# Patient Record
Sex: Female | Born: 1970 | Race: Asian | Hispanic: No | Marital: Married | Smoking: Never smoker
Health system: Southern US, Community
[De-identification: ages and names within clinical notes are randomized; demographics above are authoritative.]

## PROBLEM LIST (undated history)

## (undated) DIAGNOSIS — Z975 Presence of (intrauterine) contraceptive device: Secondary | ICD-10-CM

## (undated) DIAGNOSIS — K429 Umbilical hernia without obstruction or gangrene: Secondary | ICD-10-CM

## (undated) DIAGNOSIS — R102 Pelvic and perineal pain: Principal | ICD-10-CM

## (undated) DIAGNOSIS — N83209 Unspecified ovarian cyst, unspecified side: Secondary | ICD-10-CM

## (undated) HISTORY — PX: PELVIC LAPAROSCOPY: SHX162

## (undated) HISTORY — DX: Presence of (intrauterine) contraceptive device: Z97.5

## (undated) HISTORY — DX: Unspecified ovarian cyst, unspecified side: N83.209

## (undated) HISTORY — DX: Pelvic and perineal pain: R10.2

## (undated) HISTORY — PX: BREAST BIOPSY: SHX20

---

## 2001-01-14 ENCOUNTER — Encounter: Payer: Self-pay | Admitting: Obstetrics and Gynecology

## 2001-01-14 ENCOUNTER — Ambulatory Visit (HOSPITAL_COMMUNITY): Admission: RE | Admit: 2001-01-14 | Discharge: 2001-01-14 | Payer: Self-pay | Admitting: Obstetrics and Gynecology

## 2001-04-02 ENCOUNTER — Encounter (INDEPENDENT_AMBULATORY_CARE_PROVIDER_SITE_OTHER): Payer: Self-pay | Admitting: Specialist

## 2001-04-02 ENCOUNTER — Ambulatory Visit (HOSPITAL_BASED_OUTPATIENT_CLINIC_OR_DEPARTMENT_OTHER): Admission: RE | Admit: 2001-04-02 | Discharge: 2001-04-02 | Payer: Self-pay | Admitting: Obstetrics and Gynecology

## 2002-03-27 ENCOUNTER — Other Ambulatory Visit: Admission: RE | Admit: 2002-03-27 | Discharge: 2002-03-27 | Payer: Self-pay | Admitting: Obstetrics and Gynecology

## 2002-05-14 ENCOUNTER — Ambulatory Visit (HOSPITAL_BASED_OUTPATIENT_CLINIC_OR_DEPARTMENT_OTHER): Admission: RE | Admit: 2002-05-14 | Discharge: 2002-05-14 | Payer: Self-pay | Admitting: Obstetrics and Gynecology

## 2002-05-14 ENCOUNTER — Encounter (INDEPENDENT_AMBULATORY_CARE_PROVIDER_SITE_OTHER): Payer: Self-pay | Admitting: Specialist

## 2002-10-01 ENCOUNTER — Other Ambulatory Visit: Admission: RE | Admit: 2002-10-01 | Discharge: 2002-10-01 | Payer: Self-pay | Admitting: Gynecology

## 2003-02-15 ENCOUNTER — Encounter: Admission: RE | Admit: 2003-02-15 | Discharge: 2003-02-15 | Payer: Self-pay | Admitting: Gynecology

## 2003-02-18 ENCOUNTER — Encounter: Admission: RE | Admit: 2003-02-18 | Discharge: 2003-02-18 | Payer: Self-pay | Admitting: Gynecology

## 2003-02-22 ENCOUNTER — Encounter: Admission: RE | Admit: 2003-02-22 | Discharge: 2003-02-22 | Payer: Self-pay | Admitting: Gynecology

## 2003-02-26 ENCOUNTER — Encounter: Admission: RE | Admit: 2003-02-26 | Discharge: 2003-02-26 | Payer: Self-pay | Admitting: Gynecology

## 2003-03-02 ENCOUNTER — Encounter: Admission: RE | Admit: 2003-03-02 | Discharge: 2003-03-02 | Payer: Self-pay | Admitting: *Deleted

## 2003-03-05 ENCOUNTER — Encounter: Admission: RE | Admit: 2003-03-05 | Discharge: 2003-03-05 | Payer: Self-pay | Admitting: *Deleted

## 2003-03-09 ENCOUNTER — Encounter: Admission: RE | Admit: 2003-03-09 | Discharge: 2003-03-09 | Payer: Self-pay | Admitting: *Deleted

## 2003-03-12 ENCOUNTER — Encounter: Admission: RE | Admit: 2003-03-12 | Discharge: 2003-03-12 | Payer: Self-pay | Admitting: Gynecology

## 2003-03-16 ENCOUNTER — Encounter: Admission: RE | Admit: 2003-03-16 | Discharge: 2003-03-16 | Payer: Self-pay | Admitting: Gynecology

## 2003-03-19 ENCOUNTER — Encounter: Admission: RE | Admit: 2003-03-19 | Discharge: 2003-03-19 | Payer: Self-pay | Admitting: Gynecology

## 2003-03-23 ENCOUNTER — Encounter: Admission: RE | Admit: 2003-03-23 | Discharge: 2003-03-23 | Payer: Self-pay | Admitting: *Deleted

## 2003-03-24 ENCOUNTER — Encounter (INDEPENDENT_AMBULATORY_CARE_PROVIDER_SITE_OTHER): Payer: Self-pay | Admitting: *Deleted

## 2003-03-24 ENCOUNTER — Inpatient Hospital Stay (HOSPITAL_COMMUNITY): Admission: RE | Admit: 2003-03-24 | Discharge: 2003-03-27 | Payer: Self-pay | Admitting: Gynecology

## 2003-10-04 ENCOUNTER — Other Ambulatory Visit: Admission: RE | Admit: 2003-10-04 | Discharge: 2003-10-04 | Payer: Self-pay | Admitting: Obstetrics and Gynecology

## 2004-10-11 ENCOUNTER — Other Ambulatory Visit: Admission: RE | Admit: 2004-10-11 | Discharge: 2004-10-11 | Payer: Self-pay | Admitting: Obstetrics and Gynecology

## 2005-11-20 ENCOUNTER — Other Ambulatory Visit: Admission: RE | Admit: 2005-11-20 | Discharge: 2005-11-20 | Payer: Self-pay | Admitting: Obstetrics and Gynecology

## 2007-01-07 ENCOUNTER — Other Ambulatory Visit: Admission: RE | Admit: 2007-01-07 | Discharge: 2007-01-07 | Payer: Self-pay | Admitting: Obstetrics and Gynecology

## 2008-02-06 ENCOUNTER — Ambulatory Visit: Payer: Self-pay | Admitting: Obstetrics and Gynecology

## 2008-02-06 ENCOUNTER — Encounter: Payer: Self-pay | Admitting: Obstetrics and Gynecology

## 2008-02-06 ENCOUNTER — Other Ambulatory Visit: Admission: RE | Admit: 2008-02-06 | Discharge: 2008-02-06 | Payer: Self-pay | Admitting: Obstetrics and Gynecology

## 2008-02-17 ENCOUNTER — Ambulatory Visit: Payer: Self-pay | Admitting: Obstetrics and Gynecology

## 2008-02-27 ENCOUNTER — Ambulatory Visit: Payer: Self-pay | Admitting: Obstetrics and Gynecology

## 2008-04-05 ENCOUNTER — Ambulatory Visit: Payer: Self-pay | Admitting: Obstetrics and Gynecology

## 2009-03-07 ENCOUNTER — Ambulatory Visit: Payer: Self-pay | Admitting: Obstetrics and Gynecology

## 2009-03-07 ENCOUNTER — Other Ambulatory Visit: Admission: RE | Admit: 2009-03-07 | Discharge: 2009-03-07 | Payer: Self-pay | Admitting: Obstetrics and Gynecology

## 2009-12-22 ENCOUNTER — Encounter: Admission: RE | Admit: 2009-12-22 | Discharge: 2009-12-22 | Payer: Self-pay | Admitting: Obstetrics and Gynecology

## 2010-03-13 ENCOUNTER — Ambulatory Visit
Admission: RE | Admit: 2010-03-13 | Discharge: 2010-03-13 | Payer: Self-pay | Source: Home / Self Care | Attending: Obstetrics and Gynecology | Admitting: Obstetrics and Gynecology

## 2010-03-13 ENCOUNTER — Other Ambulatory Visit: Payer: Self-pay | Admitting: Obstetrics and Gynecology

## 2010-03-13 ENCOUNTER — Other Ambulatory Visit
Admission: RE | Admit: 2010-03-13 | Discharge: 2010-03-13 | Payer: Self-pay | Source: Home / Self Care | Admitting: Obstetrics and Gynecology

## 2010-03-15 ENCOUNTER — Ambulatory Visit
Admission: RE | Admit: 2010-03-15 | Discharge: 2010-03-15 | Payer: Self-pay | Source: Home / Self Care | Attending: Obstetrics and Gynecology | Admitting: Obstetrics and Gynecology

## 2010-06-05 ENCOUNTER — Ambulatory Visit: Payer: Self-pay | Admitting: Obstetrics and Gynecology

## 2010-06-05 ENCOUNTER — Other Ambulatory Visit: Payer: Self-pay

## 2010-07-07 NOTE — H&P (Signed)
NAME:  Carolyn Ferguson, Carolyn Ferguson                         ACCOUNT NO.:  [id number]   MEDICAL RECORD NO.:  [id number]                   PATIENT TYPE:  INP   LOCATION:  NA                                   FACILITY:  [facility]   PHYSICIAN:  Timothy P. Fontaine, M.D.           DATE OF BIRTH:  [birth year]   DATE OF ADMISSION:  03/30/2003  DATE OF DISCHARGE:                                HISTORY & PHYSICAL   Being admitted on March 30, 2003 at 9:30 for surgery.   CHIEF COMPLAINT:  1. Pregnancy at 37 weeks.  2. Twin gestation.  3. Malpresentation.   HISTORY OF PRESENT ILLNESS:  A 40 year old G64, P92 female at 75 weeks'  gestation with a twin gestation.  The patient has been followed with  antepartum testing which has been reassuring with concordant fetal growth.  She is a G 37 weeks and is admitted at this time for a primary section due  to malpresentation with the presenting fetus in the breech presentation.   PAST MEDICAL HISTORY:  Unremarkable.   PAST SURGICAL HISTORY:  Includes laparoscopy x 3 for endometriosis.   ALLERGIES:  None.   REVIEW OF SYSTEMS:  Noncontributory.   FAMILY HISTORY:  Noncontributory.   SOCIAL HISTORY:  Noncontributory.   ADMISSION PHYSICAL EXAMINATION:  VITAL SIGNS:  Afebrile.  Vital signs are  stable.  HEENT:  Normal.  LUNGS:  Clear.  CARDIAC:  Regular rate without rubs, murmurs or gallops.  ABDOMEN:  Gravid abdomen.  Positive fetal heart tones x 2.  PELVIC:  Deferred.   ASSESSMENT:  A 40 year old gravida 3 para 1 female at 61 weeks' twin  gestation concordant growth for primary cesarean section due to  malpresentation.   Risks, benefits, indications and alternatives for the procedure were  reviewed with the patient.  She also desires permanent sterilization with a  tubal ligation at the same time.  The permanency of the procedure as well as  the potential for failure were reviewed with her, and she understands and  accepts these risks.  The risk of infection  requiring prolonged antibiotics,  abscess formation, as well as wound complications requiring opening and  draining of incisions, closure by secondary intention were all discussed,  understood and accepted.  The risks of bleeding leading to hemorrhage  necessitating transfusion and the risks of transfusion to include  transfusion reaction, hepatitis, HIV, mad cow disease, and other unknown  entities were all discussed, understood and accepted.  The risk of  inadvertent injury to internal organs including bowel, bladder ureters,  vessels and nerves necessitating major exploratory reparative surgeries and  future reparative surgeries including ostomy formation were all discussed  with her.  The risks of fetal injury during the birthing process to include  musculoskeletal, neural, scalpel injuries were all discussed, understood and  accepted.  The patient's questions were answered to her satisfaction.  She  is ready to proceed with surgery.  Timothy P. Audie Box, M.D.    TPF/MEDQ  D:  03/24/2003  T:  03/24/2003  Job:  [id number]

## 2010-07-07 NOTE — Op Note (Signed)
[facility]  Patient:    Carolyn Ferguson, Carolyn Ferguson Visit Number: [id number] MRN: [id number]          Service Type: NES Location: [facility] Attending Physician:  Sharon Mt Dictated by:   Rande Brunt. Eda Paschal, M.D. Proc. Date: 04/02/01 Admit Date:  04/02/2001                             Operative Report  PREOPERATIVE DIAGNOSES:  1. Cyclical pelvic pain worse with period.  2. Right adnexal mass probably hydrosalpinx.  POSTOPERATIVE DIAGNOSES:  Endometriosis with cyclical pelvic pain.  OPERATION PERFORMED:  Diagnostic laparoscopy with laser vaporization of endometriosis and lysis of pelvic adhesive disease.  SURGEON:  Daniel L. Eda Paschal, M.D.  ANESTHESIA:  General endotracheal.  INDICATIONS FOR PROCEDURE:  The patient is a 40 year old female, gravida 2, para 1, AB 1 who presented to the office with a progressive history of increasing severe dysmenorrhea and pelvic pain. It is nonresponsive to nonsteroidal anti-inflammatory drugs as it was previously. On ultrasound, there was a large 3 x 4 cm mass on the right that looked to be most consistent with a hydrosalpinx. She now enters the hospital for laparoscopy and appropriate surgery.  FINDINGS:  At the time of laparoscopy, the first thing that was noted was a lot of old blood in the cul-de-sac, it looked old rather than fresh. Careful inspection was done to be sure there hadnt been any vascular injuries and there had not been. The right ovary was adherent to the broad ligament. When it was freed up, it was slightly enlarge and it appeared to be consistent with an endometrioma; however, when the ovary was opened it just drained some clear fluid and was felt to be just a functional cyst. There was, however, on the ovarian capsule, some hyperemic red areas that did look consistent with endometriosis. The right fallopian tube was normal with luxuriant fimbria. On the left side, the left fallopian tube was  densely adherent to the back of the uterus as was the ovary. When it was freed up, it had normal fimbria; however, there was an obvious point approximately 2/3 down the tube in the ampullary portion consistent with a stricture from a previous linear salpingostomy. The ovary itself did not show endometriosis but it was adherent. The most predominant findings, however, was a significant amount of endometriotic growth on the serosa of her uterus starting at the top of the fundus and going posteriorly. This covered a significant part of the uterine wall. In addition, there was some bulging in the cul-de-sac as if there was a cul-de-sac mass. When it was partially excised, however, no evidence of a mass was seen. There were several peritoneal windows. Because of the incredible amount of inflammatory response in the endometriosis, it was very difficult to see where it would be safe to remove tissue and not to and therefore a lot of this was left in place.  DESCRIPTION OF PROCEDURE:  After adequate general endotracheal anesthesia, the patient was placed in the dorsal lithotomy position, prepped and draped in the usual sterile manner. Her bladder was emptied with the [facility] catheter. A Hulka catheter was inserted into the uterus and pneumoperitoneum was created with the Veress needle. Then 3 1/2 liters of carbon dioxide were utilized. When the pneumoperitoneum had been created, a 10 mm trocar was placed subumbilically and through that the operating laparoscope was placed. Additional instrumentation was done through two 5 mm ports, one  suprapubically and one in the right lower quadrant. Irrigator aspirator, atraumatic grasping instruments and a neodymium YAG laser were utilized. The neodymium YAG laser was utilized with a GR-6 tip over the bare fiber at 13 watts power. First the right adnexa was freed up with the laser. The ovarian capsule was opened where it was more prominent and some clear  fluid drained consistent with a functional cyst. The ovarian capsule changes consistent with endometriosis were lasered to excise them. The fallopian tube itself looked normal with luxuriant fimbria. Next the area in the cul-de-sac was partially opened. It was very difficult to get good tissue plans because of the severe inflammatory response and it was really not possible to excise that entire area but the feeling at the end of the procedure is that probably there was no mass there but that probably the peritoneal tissue did have endometriosis and some of this was not lasered because of concern about where it was safe to laser and not. The next thing that was done was the left ovary and tube were completely freed up except there was one attachment of the ovary into the cul-de-sac that was so densely adherent and where the plane could not be ascertained that portion of the ovary was left attached. Indigo carmine was introduced through her cervix and the right fallopian tube was patent. The left fallopian tube filled approximately 2/3 and in the ampullary portion, there was an obvious stricture where I think the tube had closed after her linear salpingostomy. The extensive amount of endometriosis on the uterus was lasered and removed, some of it was cauterized with the bipolar cautery both for excision and also for hemostasis. Three of four samples were sent for tissue diagnosis. Following the termination of the procedure, there was absolutely no bleeding noted, all cul-de-sac fluid was removed. It was felt that the endometriosis had not been completely debulked but it was also felt that with the extent of what was found that it was not assessable to complete laparoscopic removal. The pneumoperitoneum was evacuated, the subumbilical incision was closed to the fascia with #0 Vicryl and all three skin incisions were closed with 4-0 monocryl. Estimated blood loss for the entire procedure was  less than 200 cc with none replaced. The patient tolerated the procedure well and left the operating room in satisfactory condition. Dictated by:   Rande Brunt. Eda Paschal, M.D.  Attending Physician:  Sharon Mt DD:  04/02/01 TD:  04/02/01 Job: [id number]

## 2010-07-07 NOTE — Discharge Summary (Signed)
NAME:  LANDI, BISCARDI                         ACCOUNT NO.:  [id number]   MEDICAL RECORD NO.:  [id number]                   PATIENT TYPE:  INP   LOCATION:  9147                                 FACILITY:  [facility]   PHYSICIAN:  Juan H. Lily Peer, M.D.             DATE OF BIRTH:  1970-11-25   DATE OF ADMISSION:  03/24/2003  DATE OF DISCHARGE:  03/27/2003                                 DISCHARGE SUMMARY   DISCHARGE DIAGNOSES:  1. Intrauterine pregnancy at 36+ weeks delivered.  2. Twins, breech/transverse presentation.  3. Spontaneous rupture of membranes.  4. Desired permanent sterilization.  5. Status post left salpingo-oophorectomy in the past.  6. Pelvic adhesive disease.  7. Status post primary low transverse Cesarean section, right tubal     sterilization, modified Pomeroy technique and lysis of adhesions by Dr.     Colin Broach on March 25, 2003.   HISTORY:  This is a 40 year old female, gravida 3, para 1, with an EDC of  April 16, 2003.  Her prenatal course has been complicated by this  initially triplet pregnancy which was reduced to twins. She underwent CVS  which revealed normal chromosomes and the patient was followed with  antepartum revealing reassuring fetal growth, both found to be  breech/transverse presentation.   HOSPITAL COURSE:  On March 24, 2003, the patient was admitted with  spontaneous rupture of membranes at 36+ weeks and therefore underwent a  primary low transverse cervical Cesarean section, right tubal sterilization,  modified Pomeroy technique, and lysis of adhesions. The patient underwent  delivery of  a female, Apgars of 9/9, weight 5 pounds 2 ounces.  A second female  was delivered with weight of 5 pounds 3 ounces, Apgars of 8 and 9.  It was  noted that there were filmy adhesions to the posterior cul-de-sac to the  uterus which was lysed. Postoperatively, the patient remained afebrile,  voiding, in stable condition. She was discharged to home on  March 27, 2003, and given [facility] Gynecology instructions.   The patient is O positive, rubella immune.  On March 25, 2003, hemoglobin  11.7.   DISPOSITION:  The patient is discharged to home, followup  appointment in  six weeks. If any problems prior to this time to return to the office. She  is given a prescription for Percocet p.r.n. pain and Motrin p.r.n. pain.     Susa Loffler, P.A.                    Juan H. Lily Peer, M.D.    TSG/MEDQ  D:  04/26/2003  T:  04/26/2003  Job:  [id number]

## 2010-07-07 NOTE — Op Note (Signed)
NAME:  Carolyn Ferguson, Carolyn Ferguson                         ACCOUNT NO.:  [id number]   MEDICAL RECORD NO.:  [id number]                   PATIENT TYPE:  INP   LOCATION:  9198                                 FACILITY:  [facility]   PHYSICIAN:  Timothy P. Fontaine, M.D.           DATE OF BIRTH:  03-20-1970   DATE OF PROCEDURE:  03/25/2003  DATE OF DISCHARGE:                                 OPERATIVE REPORT   PREOPERATIVE DIAGNOSES:  1. Pregnancy at 36-37 weeks.  2. Twins, breech transverse presentation.  3. Spontaneous rupture of membranes.  4. Desires permanent stabilization.  5. Status post left salpingo-oophorectomy in the past.   POSTOPERATIVE DIAGNOSES:  1. Pregnancy at 36-37 weeks.  2. Twins, breech transverse presentation.  3. Spontaneous rupture of membranes.  4. Desires permanent stabilization.  5. Status post left salpingo-oophorectomy in the past.  6. Pelvic adhesive disease.   PROCEDURES:  1. Primary low transverse cervical cesarean section.  2. Right tubal sterilization, modified Pomeroy technique.  3. Lysis of adhesions.   SURGEON:  Timothy P. Fontaine, M.D.   ASSISTANT:  Ivor Costa. Farrel Gobble, M.D.   ANESTHESIA:  Spinal.   ESTIMATED BLOOD LOSS:  Less than 500 mL.   COMPLICATIONS:  None.   SPECIMENS:  1. Placenta, umbilical cord clamp on twin A.  2. Samples of cord blood, twin A.  3. Samples of cord blood, twin B.   FINDINGS:  Twin A, female, at 1736, 5 pounds 2 ounces, Apgars 9 and 9.  Twin  B, female, at 1738, 5 pounds 3 ounces, Apgars 8 and 9.  Twin A was in the  frank breech presentation.  Twin B was in the transverse presentation.  A  veil of filmy adhesions from the posterior cul-de-sac to the uterus, which  was lysed with the electrocautery.  Left adnexa noted to be surgically  absent.  Right fallopian tube and ovary noted to be encased in filmy  adhesions, freed to allow the tubal sterilization.   DESCRIPTION OF PROCEDURE:  The patient was taken to the operating room,  underwent spinal anesthesia, was placed in the left tilt supine position,  and received an abdominal preparation with Betadine scrub and Betadine  solution, bladder emptied with an indwelling Foley catheterization, and the  patient was draped in the usual fashion.  After assuring adequate  anesthesia, the abdomen was sharply entered through a Pfannenstiel incision,  achieving adequate hemostasis at all levels.  The bladder flap was sharply  and bluntly developed without difficulty.  The uterus was sharply entered in  the lower uterine segment, bluntly extended laterally.  The bulging  membranes were ruptured, fluid noted to be clear.  Twin A was found in the  frank breech presentation and was delivered directly frank breech without  difficulty.  The nares and mouth suctioned, the cord doubly clamped and cut,  and the infant was handed to pediatrics in attendance.  A cord clamp was  placed on cord A for identification.  The bulging membranes of twin B were  then ruptured, the infant found to be in the transverse presentation and was  converted to a vertex and was delivered without difficulty.  A loose nuchal  cord was noted, which was reduced.  The nares and mouth were suctioned, the  rest of the infant delivered, the cord doubly clamped and cut, and the  infant was handed to pediatrics in attendance.  Samples of cord blood from  both umbilical cords were obtained.  The placenta was then spontaneously  extracted and noted to be intact and was sent to pathology.  The uterus was  exteriorized, the endometrial cavity explored with a sponge to remove all  placental membrane fragments.  The uterine incision was then closed in one  layer using 0 Vicryl suture in a running interlocking stitch.  Several  figure-of-eight sutures were placed along the incision line to achieve  ultimate hemostasis.  Attention was then turned to the tubal sterilization.  Examination of the left adnexa revealed an absent  tube and ovary, and there  was a filmy veil of adhesions across the entire posterior uterine surface,  which was sharply lysed with the electrocautery without difficulty.  The  right fallopian tube was freed, traced from its insertion to its fimbriated  end, and the mid-tubal segment was elevated, doubly ligated using 0 plain  suture, and the intervening segment sharply excised.  Tubal lumen as well as  adequate hemostasis was grossly visualized.  The uterus was then returned to  the abdomen, which was copiously irrigated showing adequate hemostasis, and  the anterior fascia was reapproximated using 0 Vicryl suture in a running  stitch.  Subcutaneous tissues were irrigated, electrocautery used to achieve  ultimate hemostasis, and the skin was reapproximated using 4-0 Vicryl in a  running subcuticular stitch.  Steri-Strips and Benzoin were applied and a  sterile dressing applied.  The patient taken to the recovery room in good  condition, having tolerated the procedure well.  Of note, the patient did  receive 1 a Ancef on cord clamping.                                               Timothy P. Audie Box, M.D.    TPF/MEDQ  D:  03/24/2003  T:  03/25/2003  Job:  [id number]

## 2010-07-07 NOTE — Op Note (Signed)
NAME:  Carolyn Ferguson, Carolyn Ferguson                           ACCOUNT NO.:  [id number]   MEDICAL RECORD NO.:  [id number]                   PATIENT TYPE:  AMB   LOCATION:  [facility]                                 FACILITY:  [facility]   PHYSICIAN:  Daniel L. Eda Paschal, M.D.           DATE OF BIRTH:  03/01/1970   DATE OF PROCEDURE:  05/14/2002  DATE OF DISCHARGE:                                 OPERATIVE REPORT   PREOPERATIVE DIAGNOSES:  Pelvic pain with endometriosis.   POSTOPERATIVE DIAGNOSES:  Pelvic pain with endometriosis including bilateral  endometriomas.   OPERATION:  Diagnostic laparoscopy with excision of endometriomas, left  salpingectomy, laser vaporization of endometriosis.   SURGEON:  Daniel L. Eda Paschal, M.D.   FIRST ASSISTANT:  Ivor Costa. Farrel Gobble, M.D.   INDICATIONS FOR PROCEDURE:  The patient is a 40 year old female who now has  a several year history of severe pelvic pain associated with endometriosis.  Her periods are becoming increasingly worse in terms of both flow and  dysmenorrhea. She had undergone laparoscopy previously with laser of  endometriosis but on ultrasound she has evidence of bilateral endometriomas  and a possible left hydrosalpinx. She now enters the hospital for definitive  surgery for treatment of the above.   FINDINGS:  At the time of surgery, the patient had extensive endometriosis  although it actually looked better than it had on her first operation. Her  right ovary was enlarged by an endometrioma of about 3 cm, the right  fallopian tube looked basically normal although it was somewhat adherent to  the ovary and had luxuriant fimbria. The right ovary was adherent to the  side wall of the pelvis, the uterus had multiple areas of endometriosis on  the serosa. The sigmoid colon was adherent both to the left adnexa and to  the uterus posteriorly. There was an endometrioma on the left side which was  either part of the surface of the ovary or maybe more likely an  area of  endometriosis adjacent to the ovary and adherent to the back wall of the  uterus and to the sigmoid colon. The left fallopian tube was severely  diseased by endometriosis and densely adherent to the ovary.   DESCRIPTION OF PROCEDURE:  After adequate general endotracheal anesthesia,  the patient was placed in the dorsal lithotomy position, prepped and draped  in the usual sterile manner. A Foley catheter was inserted in the patient's  bladder, a Hulka catheter was put into the ureter. A pneumoperitoneum was  created subumbilically with a Veress needle and then a 10 mm trocar was  placed subumbilically. Two 5 mm ports were placed in the pelvis on the right  lower quadrant and left lower quadrant after the operating laparoscope had  first been placed to be sure that the pneumoperitoneum had been correctly  created and that there was no trauma which there was not. The laparoscope  was attached to a camera, the  other two ports were placed under direct  vision. At this point, pictures were taken for documentation. Through the  procedure, several different instrumentation was utilized to treat the  endometriosis, one was the neodymium YAG laser with a GR-4 tip over the bare  fiber at 12 watts power. In addition, bipolar instrumentation was also  utilized as well. First adhesions were freed up so that the adnexa could be  seen. Some of them could be actually freed up by blunt dissection using  hydrodissection. Some were freed up with the neodymium YAG laser. There was  omentum attached to endometriosis attached to the uterus which was freed up  and sent as a specimen. On the left side on the posterior wall of the uterus  adjacent to the ovary, an area of endometriosis which drained chocolate  material was seen. There was either a surface endometrioma or possibly just  a collection of endometriosis off the posterior wall of the uterus. This was  entered, it was drained and then it was  completely excised. The left  fallopian tube was significantly diseased and it was removed with bipolar  and cutting without interfering with the blood supply to the ovary at all.  Attention was next turned to the endometrium on the right ovary. It was  opened with a laser and then the cyst wall could be separated from the ovary  using grasping instruments and the entire cyst wall was removed and sent to  pathology for tissue diagnosis. The other areas of endometriosis that were  seen were lasered until they were gone. The sigmoid colon was not completely  freed up from the posterior wall of the uterus because of its dense  adherence to it and it was felt that the bowel would probably be entered if  this dissection was completed and the patient did not have a bowel prep.  Copious irrigation was done without with Ringer's lactate and finally at the  end of the procedure there was absolutely no bleeding noted. An attempt was  made to remove all peritoneal fluid with some of it even drained after the  trocars had been removed. The subumbilical fascial incision was closed with  #0 Vicryl and all three skin incisions were closed with 3-0 Monocryl. At the  termination of the procedure, the patient had drained significant clear  urine from her Foley catheter which was removed. Her Hulka catheter was also  removed. Specimen sent to pathology included endometriosis of the pelvis,  cyst wall of the endometriomas, left fallopian tube with endometriosis, and  endometriosis on the serosa of the uterus. The patient left the operating  room in satisfactory condition.                                               Daniel L. Eda Paschal, M.D.    Tonette Bihari  D:  05/14/2002  T:  05/14/2002  Job:  [id number]

## 2010-07-24 ENCOUNTER — Other Ambulatory Visit: Payer: BC Managed Care – PPO

## 2010-07-24 ENCOUNTER — Ambulatory Visit (INDEPENDENT_AMBULATORY_CARE_PROVIDER_SITE_OTHER): Payer: BC Managed Care – PPO | Admitting: Obstetrics and Gynecology

## 2010-07-24 DIAGNOSIS — N831 Corpus luteum cyst of ovary, unspecified side: Secondary | ICD-10-CM

## 2010-07-24 DIAGNOSIS — N809 Endometriosis, unspecified: Secondary | ICD-10-CM

## 2010-07-24 DIAGNOSIS — R1032 Left lower quadrant pain: Secondary | ICD-10-CM

## 2010-08-14 ENCOUNTER — Other Ambulatory Visit (INDEPENDENT_AMBULATORY_CARE_PROVIDER_SITE_OTHER): Payer: BC Managed Care – PPO

## 2010-08-14 ENCOUNTER — Institutional Professional Consult (permissible substitution): Payer: BC Managed Care – PPO | Admitting: Obstetrics and Gynecology

## 2010-08-14 DIAGNOSIS — N83209 Unspecified ovarian cyst, unspecified side: Secondary | ICD-10-CM

## 2010-08-18 ENCOUNTER — Other Ambulatory Visit: Payer: BC Managed Care – PPO

## 2010-09-01 ENCOUNTER — Ambulatory Visit (HOSPITAL_COMMUNITY): Admission: RE | Admit: 2010-09-01 | Payer: Self-pay | Source: Ambulatory Visit | Admitting: Obstetrics and Gynecology

## 2010-10-18 ENCOUNTER — Encounter: Payer: Self-pay | Admitting: Obstetrics and Gynecology

## 2010-10-18 ENCOUNTER — Ambulatory Visit (INDEPENDENT_AMBULATORY_CARE_PROVIDER_SITE_OTHER): Payer: BC Managed Care – PPO | Admitting: Obstetrics and Gynecology

## 2010-10-18 DIAGNOSIS — R82998 Other abnormal findings in urine: Secondary | ICD-10-CM

## 2010-10-18 DIAGNOSIS — M549 Dorsalgia, unspecified: Secondary | ICD-10-CM

## 2010-10-18 DIAGNOSIS — N390 Urinary tract infection, site not specified: Secondary | ICD-10-CM

## 2010-10-18 MED ORDER — NITROFURANTOIN MONOHYD MACRO 100 MG PO CAPS: 100.0000 mg | ORAL_CAPSULE | Freq: Two times a day (BID) | ORAL | Status: AC

## 2010-10-18 NOTE — Progress Notes (Signed)
The patient came in today with a short history of dysuria frequency lower back pain. She has had UTIs in the past and she thinks that she has. This is not related to intercourse at all.  Urinalysis was grossly abnormal.  Assessment: UTI  Plan: Macrobid twice a day with food for 7 days. Urine culture done. Followup UA and culture in one week.

## 2010-11-01 ENCOUNTER — Other Ambulatory Visit: Payer: Self-pay | Admitting: Obstetrics and Gynecology

## 2010-11-01 ENCOUNTER — Ambulatory Visit (INDEPENDENT_AMBULATORY_CARE_PROVIDER_SITE_OTHER): Payer: BC Managed Care – PPO | Admitting: Obstetrics and Gynecology

## 2010-11-01 ENCOUNTER — Other Ambulatory Visit: Payer: BC Managed Care – PPO

## 2010-11-01 DIAGNOSIS — N83209 Unspecified ovarian cyst, unspecified side: Secondary | ICD-10-CM

## 2010-11-01 DIAGNOSIS — M545 Low back pain, unspecified: Secondary | ICD-10-CM

## 2010-11-01 DIAGNOSIS — D3910 Neoplasm of uncertain behavior of unspecified ovary: Secondary | ICD-10-CM

## 2010-11-01 DIAGNOSIS — N8300 Follicular cyst of ovary, unspecified side: Secondary | ICD-10-CM

## 2010-11-01 DIAGNOSIS — R1032 Left lower quadrant pain: Secondary | ICD-10-CM

## 2010-11-01 DIAGNOSIS — N390 Urinary tract infection, site not specified: Secondary | ICD-10-CM

## 2010-11-01 DIAGNOSIS — N921 Excessive and frequent menstruation with irregular cycle: Secondary | ICD-10-CM

## 2010-11-01 NOTE — Progress Notes (Signed)
The patient came back today for followup ultrasound. She also needed her urine rechecked because of a UTI that we treated her for. Her pelvic pain is much improved. She says she will  just have  monthly symptoms that are tolerable. Her urine symptoms are gone.  Ultrasound reveals a retroflexed uterus with IUD in normal position. Right ovary is normal. Left ovary shows a thick walled cyst with internal low level echoes. It is 1.7 cm. This is a significant reduction in size and the last one which was 3.7 cm. Her cul-de-sac is free of fluid.  Assessment: 1. UTI resolved #2. Ovarian cyst reduced in size with pain better.  Plan: 1. We will check a urine today to. She was pushing computer recall for ultrasound in 6 months but will, in January for yearly exam.

## 2011-03-05 ENCOUNTER — Other Ambulatory Visit: Payer: Self-pay | Admitting: Obstetrics and Gynecology

## 2011-03-05 ENCOUNTER — Ambulatory Visit (INDEPENDENT_AMBULATORY_CARE_PROVIDER_SITE_OTHER): Payer: BC Managed Care – PPO | Admitting: Obstetrics and Gynecology

## 2011-03-05 DIAGNOSIS — N390 Urinary tract infection, site not specified: Secondary | ICD-10-CM

## 2011-03-05 DIAGNOSIS — M549 Dorsalgia, unspecified: Secondary | ICD-10-CM

## 2011-03-05 DIAGNOSIS — R82998 Other abnormal findings in urine: Secondary | ICD-10-CM

## 2011-03-05 DIAGNOSIS — N898 Other specified noninflammatory disorders of vagina: Secondary | ICD-10-CM

## 2011-03-05 LAB — WET PREP FOR TRICH, YEAST, CLUE
Clue Cells Wet Prep HPF POC: NEGATIVE
Trich, Wet Prep: NEGATIVE
Yeast Wet Prep HPF POC: NEGATIVE

## 2011-03-05 MED ORDER — NITROFURANTOIN MONOHYD MACRO 100 MG PO CAPS: 100.0000 mg | ORAL_CAPSULE | Freq: Two times a day (BID) | ORAL | Status: AC

## 2011-03-05 NOTE — Progress Notes (Signed)
Patient came to see me today with a history of left-sided back pain and urinary frequency. These are typical symptoms she gets when she has a UTI. She is starting to notice a pattern that this occurs after intercourse. She is also aware of some vaginal discharge and irritation. She is having no vaginal bleeding. She is having no pelvic pain. She wanted to know if back  discomfort also could be related to a recurrent ovarian cyst.  Pelvic exam: External: Within normal limits. BUS within normal limits. Vaginal exam: Within normal limits. Cervix: Clean. IUD string visible.  Uterus: Within normal limits. Adnexa: Within normal limits. Rectovaginal exam: Within normal limits.  Wet prep positive for yeast. Urinalysis abnormal. Kennon Portela present for exam.  Assessment: #1. UTI #2. Yeast vaginitis  Plan: Macrobid twice a day with food for 7 days. Followup UA in one week. Diflucan 150 mg daily for 3 days. Start her on Macrodantin 50 mg tablets take one postcoitally.

## 2011-03-06 ENCOUNTER — Other Ambulatory Visit: Payer: Self-pay | Admitting: Gynecology

## 2011-03-06 ENCOUNTER — Other Ambulatory Visit: Payer: Self-pay | Admitting: Obstetrics and Gynecology

## 2011-03-06 DIAGNOSIS — N390 Urinary tract infection, site not specified: Secondary | ICD-10-CM

## 2011-03-06 LAB — URINALYSIS W MICROSCOPIC + REFLEX CULTURE

## 2011-03-06 LAB — URINALYSIS, ROUTINE W REFLEX MICROSCOPIC
Glucose, UA: NEGATIVE mg/dL
Ketones, ur: NEGATIVE mg/dL
Protein, ur: NEGATIVE mg/dL

## 2011-03-06 LAB — URINALYSIS, MICROSCOPIC ONLY

## 2011-03-07 LAB — URINE CULTURE: Colony Count: 55000

## 2011-04-30 ENCOUNTER — Other Ambulatory Visit: Payer: Self-pay | Admitting: Obstetrics and Gynecology

## 2011-04-30 ENCOUNTER — Ambulatory Visit (INDEPENDENT_AMBULATORY_CARE_PROVIDER_SITE_OTHER): Payer: BC Managed Care – PPO | Admitting: Obstetrics and Gynecology

## 2011-04-30 DIAGNOSIS — N83209 Unspecified ovarian cyst, unspecified side: Secondary | ICD-10-CM

## 2011-04-30 DIAGNOSIS — M549 Dorsalgia, unspecified: Secondary | ICD-10-CM

## 2011-04-30 LAB — URINALYSIS W MICROSCOPIC + REFLEX CULTURE
Glucose, UA: NEGATIVE mg/dL
Nitrite: NEGATIVE
Protein, ur: NEGATIVE mg/dL
Urobilinogen, UA: 0.2 mg/dL (ref 0.0–1.0)

## 2011-04-30 MED ORDER — CIPROFLOXACIN HCL 250 MG PO TABS: 250.0000 mg | ORAL_TABLET | Freq: Two times a day (BID) | ORAL | Status: AC

## 2011-04-30 NOTE — Progress Notes (Signed)
Patient came to see me today with a 2 to three-day history of left flank pain. She has no dysuria or frequency. We have treated her twice since August for UTI. Both times she had these  Symptoms. We used Macrobid both times. She has been doing post coital Macrodantin because there appears to be a relationship with sex. She had sex approximately one week ago which relates to the above.  Exam: Kennon Portela present. Abdomen: Somewhat tender but no rebound. Tenderness mostly suprapubic. Pelvic exam: External: Within normal limits. BUS within normal limits. Vaginal exam: Within normal limits. Cervix: Clean. IUD string visible Uterus: Within normal limits but tender. Adnexa: right within normal limits. Left adnexa slightly enlarged to 4+ centimeters and tender. Rectovaginal exam: Within normal limits.  Urinalysis 3-6 white blood cells per high-power field.  Assessment: Probable recurrent UTI. Recurrent left adnexal mass.  Plan: Urine culture done. Cipro 250 milligrams twice a day for 7 days. Ultrasound scheduled. Discussed switching postcoital antibiotics to Septra. Discussed urological consult. We will see what culture shows and make a decision on the day of ultrasound.

## 2011-05-01 LAB — URINE CULTURE: Colony Count: 9000

## 2011-05-03 ENCOUNTER — Other Ambulatory Visit: Payer: BC Managed Care – PPO

## 2011-05-03 ENCOUNTER — Ambulatory Visit: Payer: BC Managed Care – PPO | Admitting: Obstetrics and Gynecology

## 2011-05-03 ENCOUNTER — Ambulatory Visit (INDEPENDENT_AMBULATORY_CARE_PROVIDER_SITE_OTHER): Payer: BC Managed Care – PPO

## 2011-05-03 ENCOUNTER — Ambulatory Visit (INDEPENDENT_AMBULATORY_CARE_PROVIDER_SITE_OTHER): Payer: BC Managed Care – PPO | Admitting: Obstetrics and Gynecology

## 2011-05-03 DIAGNOSIS — N83209 Unspecified ovarian cyst, unspecified side: Secondary | ICD-10-CM

## 2011-05-03 DIAGNOSIS — N831 Corpus luteum cyst of ovary, unspecified side: Secondary | ICD-10-CM

## 2011-05-03 DIAGNOSIS — N644 Mastodynia: Secondary | ICD-10-CM

## 2011-05-03 NOTE — Progress Notes (Signed)
Patient came back today for ultrasound. Her pain is 90% better. She has been on Cipro. Her urine culture came back negative. She's been having a lot of breast tenderness. It is bilateral. Her nipples are also sensitive.  Breast exam: Kennon Portela present. Her breasts were carefully examined the sitting and lying position. She is lumpy in the upper-outer quadrants of both breasts without dominant lesion.  Ultrasound: On ultrasound today her uterus is anteverted with an IUD in normal position. The endometrial echo is 5.5 mm. Her right ovary is normal. Her left ovary shows a thin walled cystic mass of 4.5 cm. Is consistent with her pelvic findings on Monday. There is a reticular echo pattern. It appears to be a hemorrhagic cyst. There is negative vascular flow to the mass. Her cul-de-sac is free of fluid.  Assessment: #1. Left ovarian cyst #2. Pelvic pain significantly better #3. Mastodynia  Plan: Patient will stop her Cipro. She will let me know if mastodynia persists. Assuming she continues to be relatively pain-free she will return in 3 months for followup ultrasound. She will call me sooner if pain increases.

## 2011-05-15 ENCOUNTER — Other Ambulatory Visit (HOSPITAL_COMMUNITY)
Admission: RE | Admit: 2011-05-15 | Discharge: 2011-05-15 | Disposition: A | Discharge status: Home / Self Care | Payer: BC Managed Care – PPO | Source: Ambulatory Visit | Attending: Obstetrics and Gynecology | Admitting: Obstetrics and Gynecology

## 2011-05-15 ENCOUNTER — Ambulatory Visit (INDEPENDENT_AMBULATORY_CARE_PROVIDER_SITE_OTHER): Payer: BC Managed Care – PPO | Admitting: Obstetrics and Gynecology

## 2011-05-15 ENCOUNTER — Encounter: Payer: Self-pay | Admitting: Obstetrics and Gynecology

## 2011-05-15 VITALS — BP 110/60 | Ht 65.0 in | Wt 129.0 lb

## 2011-05-15 DIAGNOSIS — Z01419 Encounter for gynecological examination (general) (routine) without abnormal findings: Secondary | ICD-10-CM

## 2011-05-15 DIAGNOSIS — N83209 Unspecified ovarian cyst, unspecified side: Secondary | ICD-10-CM | POA: Insufficient documentation

## 2011-05-15 DIAGNOSIS — N809 Endometriosis, unspecified: Secondary | ICD-10-CM | POA: Insufficient documentation

## 2011-05-15 NOTE — Progress Notes (Signed)
Patient came back to see me for her annual GYN exam. Her pain which we recently saw her for is gone. She remains amenorrhic with her IUD. Prior to the IUD she has severe dysmenorrhea and menorrhagia. She is scheduled for followup ultrasound for a left ovarian cyst in 3 months. She had a mammogram last year. She does her lab work from her PCP.  Physical examination: Kennon Portela present. HEENT within normal limits. Neck: Thyroid not large. No masses. Supraclavicular nodes: not enlarged. Breasts: Examined in both sitting and lying  position. No skin changes and no masses. Abdomen: Soft no guarding rebound or masses or hernia. Pelvic: External: Within normal limits. BUS: Within normal limits. Vaginal:within normal limits. Good estrogen effect. No evidence of cystocele rectocele or enterocele. Cervix: clean IUD string visible. Uterus: Normal size and shape. Adnexa: No masses. Rectovaginal exam: Confirmatory and negative. Extremities: Within normal limits.  Assessment: Endometriosis. Left ovarian cyst.  Plan: Continue yearly mammograms. Followup ultrasound in 3 months.

## 2011-05-16 LAB — URINALYSIS W MICROSCOPIC + REFLEX CULTURE
Bacteria, UA: NONE SEEN
Casts: NONE SEEN
Hgb urine dipstick: NEGATIVE
Ketones, ur: NEGATIVE mg/dL
Leukocytes, UA: NEGATIVE
Nitrite: NEGATIVE
Protein, ur: NEGATIVE mg/dL
Urobilinogen, UA: 0.2 mg/dL (ref 0.0–1.0)
pH: 5.5 (ref 5.0–8.0)

## 2011-05-29 ENCOUNTER — Other Ambulatory Visit: Payer: Self-pay | Admitting: Obstetrics and Gynecology

## 2011-05-29 DIAGNOSIS — Z1231 Encounter for screening mammogram for malignant neoplasm of breast: Secondary | ICD-10-CM

## 2011-06-06 ENCOUNTER — Ambulatory Visit
Admission: RE | Admit: 2011-06-06 | Discharge: 2011-06-06 | Disposition: A | Discharge status: Home / Self Care | Payer: BC Managed Care – PPO | Source: Ambulatory Visit | Attending: Obstetrics and Gynecology | Admitting: Obstetrics and Gynecology

## 2011-06-06 DIAGNOSIS — Z1231 Encounter for screening mammogram for malignant neoplasm of breast: Secondary | ICD-10-CM

## 2012-05-15 ENCOUNTER — Encounter: Payer: BC Managed Care – PPO | Admitting: Women's Health

## 2012-07-15 ENCOUNTER — Other Ambulatory Visit: Payer: Self-pay

## 2012-07-15 DIAGNOSIS — Z1231 Encounter for screening mammogram for malignant neoplasm of breast: Secondary | ICD-10-CM

## 2012-07-18 ENCOUNTER — Ambulatory Visit
Admission: RE | Admit: 2012-07-18 | Discharge: 2012-07-18 | Disposition: A | Discharge status: Home / Self Care | Payer: BC Managed Care – PPO | Source: Ambulatory Visit

## 2012-07-18 DIAGNOSIS — Z1231 Encounter for screening mammogram for malignant neoplasm of breast: Secondary | ICD-10-CM

## 2013-04-21 ENCOUNTER — Ambulatory Visit (INDEPENDENT_AMBULATORY_CARE_PROVIDER_SITE_OTHER): Payer: BC Managed Care – PPO | Admitting: General Surgery

## 2013-04-21 ENCOUNTER — Encounter (INDEPENDENT_AMBULATORY_CARE_PROVIDER_SITE_OTHER): Payer: Self-pay | Admitting: General Surgery

## 2013-04-21 VITALS — BP 116/74 | HR 72 | Temp 97.5°F | Resp 14 | Ht 65.0 in | Wt 129.0 lb

## 2013-04-21 DIAGNOSIS — K429 Umbilical hernia without obstruction or gangrene: Secondary | ICD-10-CM | POA: Insufficient documentation

## 2013-04-21 NOTE — Progress Notes (Signed)
Patient ID: Carolyn Ferguson, female   DOB: 02/06/1971, 43 y.o.   MRN: [id number]  Chief Complaint  Patient presents with  . New Evaluation    eval Umb Hernia    HPI Carolyn Ferguson is a 43 y.o. female.  The patient comes in for evaluation of a minimally symptomatic umbilical hernia.  HPI The patient has noticed a lump in her periumbilical area for at least the last one to 2 months. It does not cause her any discomfort however because of its presence she wanted some type of evaluation to confirm the diagnosis. She is otherwise healthy. Past Medical History  Diagnosis Date  . Endometriosis   . IUD (intrauterine device) in place     MIRENA    . Ovarian cyst     Past Surgical History  Procedure Laterality Date  . Cesarean section      with right salpingectomy  . Pelvic laparoscopy      DX LAP W LASER VAP OF ENDOMETRIOSIS--LEFT SALPINGECTOMY    Family History  Problem Relation Age of Onset  . Diabetes Father   . Heart disease Father   . Heart disease Paternal Uncle     Social History History  Substance Use Topics  . Smoking status: Never Smoker   . Smokeless tobacco: Never Used  . Alcohol Use: 0.6 oz/week    1 Glasses of wine per week     Comment: rare    No Known Allergies  Current Outpatient Prescriptions  Medication Sig Dispense Refill  . levonorgestrel (MIRENA) 20 MCG/24HR IUD 1 each by Intrauterine route once. Inserted on 02-27-08        No current facility-administered medications for this visit.    Review of Systems Review of Systems  All other systems reviewed and are negative.    Blood pressure 116/74, pulse 72, temperature 97.5 F (36.4 C), temperature source Oral, resp. rate 14, height 5\' 5"  (1.651 m), weight 129 lb (58.514 kg).  Physical Exam Physical Exam  Constitutional: She is oriented to person, place, and time. She appears well-developed and well-nourished.  HENT:  Head: Normocephalic and atraumatic.  Eyes: Conjunctivae and EOM are normal.  Pupils are equal, round, and reactive to light.  Neck: Normal range of motion. Neck supple.  Cardiovascular: Normal rate and regular rhythm.   Pulmonary/Chest: Effort normal and breath sounds normal.  Abdominal: Soft. Bowel sounds are normal. A hernia (small umbilical defect) is present.  Musculoskeletal: Normal range of motion.  Neurological: She is alert and oriented to person, place, and time. She has normal reflexes.  Skin: Skin is warm and dry.  Psychiatric: She has a normal mood and affect. Her behavior is normal. Thought content normal.    Data Reviewed The patient's last office visit with her primary care physician was reviewed.  Assessment    Minimally symptomatic and very small umbilical hernia.     Plan    There is no urgency in repairing his umbilical hernia. However, these do tend to enlarge over time. The patient notes her self that the frequency that she has to reduce her hernia has increased. She continues to have no discomfort or pain.  I would recommend at some point that the patient have this repaired however it is completely elective at this time. I've advised her that if she should ever get incarcerated that she should immediately call our office or go to the emergency department. I believe that the likelihood of this happening is very small.  Currently the  hernia is of a size that mesh would be unnecessary. She will contact me in the future if she decides to be repaired.  Advised her not to use any types of binders to help prevent the hernia from coming out. She can continue to exercise as usual but note was a not the frequency of the hernia coming out increases. I be glad to see her again in the future at any time.        Gwenyth Ober 04/21/2013, 10:38 AM

## 2013-06-23 ENCOUNTER — Other Ambulatory Visit: Payer: Self-pay

## 2013-06-23 DIAGNOSIS — Z1231 Encounter for screening mammogram for malignant neoplasm of breast: Secondary | ICD-10-CM

## 2013-07-20 ENCOUNTER — Ambulatory Visit: Payer: BC Managed Care – PPO

## 2013-07-20 ENCOUNTER — Ambulatory Visit
Admission: RE | Admit: 2013-07-20 | Discharge: 2013-07-20 | Disposition: A | Discharge status: Home / Self Care | Payer: BC Managed Care – PPO | Source: Ambulatory Visit

## 2013-07-20 ENCOUNTER — Encounter (INDEPENDENT_AMBULATORY_CARE_PROVIDER_SITE_OTHER): Payer: Self-pay

## 2013-07-20 DIAGNOSIS — Z1231 Encounter for screening mammogram for malignant neoplasm of breast: Secondary | ICD-10-CM

## 2013-12-21 ENCOUNTER — Encounter (INDEPENDENT_AMBULATORY_CARE_PROVIDER_SITE_OTHER): Payer: Self-pay | Admitting: General Surgery

## 2013-12-31 ENCOUNTER — Other Ambulatory Visit: Payer: Self-pay | Admitting: Podiatry

## 2014-07-08 ENCOUNTER — Other Ambulatory Visit: Payer: Self-pay

## 2014-07-08 DIAGNOSIS — Z1231 Encounter for screening mammogram for malignant neoplasm of breast: Secondary | ICD-10-CM

## 2014-07-29 ENCOUNTER — Ambulatory Visit
Admission: RE | Admit: 2014-07-29 | Discharge: 2014-07-29 | Disposition: A | Discharge status: Home / Self Care | Payer: BLUE CROSS/BLUE SHIELD | Source: Ambulatory Visit

## 2014-07-29 DIAGNOSIS — Z1231 Encounter for screening mammogram for malignant neoplasm of breast: Secondary | ICD-10-CM

## 2014-07-30 ENCOUNTER — Other Ambulatory Visit: Payer: Self-pay | Admitting: Obstetrics & Gynecology

## 2014-07-30 DIAGNOSIS — R928 Other abnormal and inconclusive findings on diagnostic imaging of breast: Secondary | ICD-10-CM

## 2014-08-04 ENCOUNTER — Ambulatory Visit
Admission: RE | Admit: 2014-08-04 | Discharge: 2014-08-04 | Disposition: A | Discharge status: Home / Self Care | Payer: BLUE CROSS/BLUE SHIELD | Source: Ambulatory Visit | Attending: Obstetrics & Gynecology | Admitting: Obstetrics & Gynecology

## 2014-08-04 ENCOUNTER — Other Ambulatory Visit: Payer: BLUE CROSS/BLUE SHIELD

## 2014-08-04 DIAGNOSIS — R928 Other abnormal and inconclusive findings on diagnostic imaging of breast: Secondary | ICD-10-CM

## 2014-10-19 ENCOUNTER — Other Ambulatory Visit: Payer: Self-pay | Admitting: Obstetrics & Gynecology

## 2014-10-19 DIAGNOSIS — R5381 Other malaise: Secondary | ICD-10-CM

## 2014-10-20 ENCOUNTER — Other Ambulatory Visit: Payer: Self-pay | Admitting: Obstetrics & Gynecology

## 2014-10-20 DIAGNOSIS — N6310 Unspecified lump in the right breast, unspecified quadrant: Secondary | ICD-10-CM

## 2014-10-29 ENCOUNTER — Other Ambulatory Visit: Payer: BLUE CROSS/BLUE SHIELD

## 2014-11-16 ENCOUNTER — Other Ambulatory Visit: Payer: Self-pay | Admitting: Obstetrics & Gynecology

## 2014-11-16 DIAGNOSIS — N6310 Unspecified lump in the right breast, unspecified quadrant: Secondary | ICD-10-CM

## 2014-11-30 ENCOUNTER — Other Ambulatory Visit: Payer: BLUE CROSS/BLUE SHIELD

## 2014-11-30 ENCOUNTER — Ambulatory Visit
Admission: RE | Admit: 2014-11-30 | Discharge: 2014-11-30 | Disposition: A | Discharge status: Home / Self Care | Payer: BLUE CROSS/BLUE SHIELD | Source: Ambulatory Visit | Attending: Obstetrics & Gynecology | Admitting: Obstetrics & Gynecology

## 2014-11-30 DIAGNOSIS — N6310 Unspecified lump in the right breast, unspecified quadrant: Secondary | ICD-10-CM

## 2015-03-15 ENCOUNTER — Ambulatory Visit: Payer: Self-pay | Admitting: General Surgery

## 2015-04-26 ENCOUNTER — Encounter (HOSPITAL_BASED_OUTPATIENT_CLINIC_OR_DEPARTMENT_OTHER): Payer: Self-pay | Admitting: *Deleted

## 2015-04-28 ENCOUNTER — Encounter (HOSPITAL_BASED_OUTPATIENT_CLINIC_OR_DEPARTMENT_OTHER)
Admission: RE | Admit: 2015-04-28 | Discharge: 2015-04-28 | Disposition: A | Discharge status: Home / Self Care | Payer: BLUE CROSS/BLUE SHIELD | Source: Ambulatory Visit | Attending: General Surgery | Admitting: General Surgery

## 2015-04-28 DIAGNOSIS — Z01812 Encounter for preprocedural laboratory examination: Secondary | ICD-10-CM | POA: Insufficient documentation

## 2015-04-28 DIAGNOSIS — K429 Umbilical hernia without obstruction or gangrene: Secondary | ICD-10-CM | POA: Diagnosis not present

## 2015-04-28 LAB — CBC WITH DIFFERENTIAL/PLATELET
BASOS PCT: 0 %
Basophils Absolute: 0 10*3/uL (ref 0.0–0.1)
EOS ABS: 0.1 10*3/uL (ref 0.0–0.7)
Eosinophils Relative: 1 %
HCT: 40.1 % (ref 36.0–46.0)
HEMOGLOBIN: 13.6 g/dL (ref 12.0–15.0)
Lymphocytes Relative: 27 %
Lymphs Abs: 1.8 10*3/uL (ref 0.7–4.0)
MCH: 30.7 pg (ref 26.0–34.0)
MCHC: 33.9 g/dL (ref 30.0–36.0)
MCV: 90.5 fL (ref 78.0–100.0)
MONO ABS: 0.5 10*3/uL (ref 0.1–1.0)
Monocytes Relative: 7 %
Neutro Abs: 4.2 10*3/uL (ref 1.7–7.7)
Neutrophils Relative %: 65 %
Platelets: 209 10*3/uL (ref 150–400)
RBC: 4.43 MIL/uL (ref 3.87–5.11)
RDW: 12.9 % (ref 11.5–15.5)
WBC: 6.6 10*3/uL (ref 4.0–10.5)

## 2015-05-01 NOTE — H&P (Signed)
Carolyn Ferguson 03/15/2015 10:06 AM Location: [facility] Patient #: [id number] DOB: [birth year] Married / Language: English / Race: American [place] or [place] Native Female   History of Present Illness Carolyn Ferguson. Hulen Skains MD; 03/15/2015 10:39 AM) Patient words: reck.  The patient is a 45 year old female who presents with an umbilical hernia. Management changes made at the last visit include none (Not very symptomatic at that time.). Symptoms include bulge at the umbilicus and abdominal pain (At the site of the hernia). The pain is located in the mid-abdomen (periumbilical). The patient describes this as moderate in severity.   Past Surgical History Marjean Donna, CMA; 03/15/2015 10:07 AM) Cesarean Section - 1  Diagnostic Studies History Marjean Donna, CMA; 03/15/2015 10:07 AM) Colonoscopy never Mammogram within last year Pap Smear 1-5 years ago  Allergies Davy Pique Bynum, CMA; 03/15/2015 10:07 AM) No Known Drug Allergies01/24/2017  Medication History (Sonya Bynum, CMA; 03/15/2015 10:07 AM) No Current Medications Medications Reconciled  Social History Marjean Donna, CMA; 03/15/2015 10:07 AM) Alcohol use Occasional alcohol use. No caffeine use No drug use Tobacco use Never smoker.  Family History Marjean Donna, [place]; 03/15/2015 10:07 AM) Arthritis Mother. Heart Disease Father.  Pregnancy / Birth History Marjean Donna, [place]; 03/15/2015 10:07 AM) Age at menarche 80 years. Contraceptive History Intrauterine device. Gravida 3 Irregular periods Maternal age 5-30 Para 3    Review of Systems Davy Pique Bynum CMA; 03/15/2015 10:07 AM) General Not Present- Appetite Loss, Chills, Fatigue, Fever, Night Sweats, Weight Gain and Weight Loss. Skin Not Present- Change in Wart/Mole, Dryness, Hives, Jaundice, New Lesions, Non-Healing Wounds, Rash and Ulcer. HEENT Present- Wears glasses/contact lenses. Not Present- Earache, Hearing Loss, Hoarseness, Nose Bleed, Oral Ulcers, Ringing  in the Ears, Seasonal Allergies, Sinus Pain, Sore Throat, Visual Disturbances and Yellow Eyes. Respiratory Not Present- Bloody sputum, Chronic Cough, Difficulty Breathing, Snoring and Wheezing. Breast Not Present- Breast Mass, Breast Pain, Nipple Discharge and Skin Changes. Cardiovascular Not Present- Chest Pain, Difficulty Breathing Lying Down, Leg Cramps, Palpitations, Rapid Heart Rate, Shortness of Breath and Swelling of Extremities. Gastrointestinal Not Present- Abdominal Pain, Bloating, Bloody Stool, Change in Bowel Habits, Chronic diarrhea, Constipation, Difficulty Swallowing, Excessive gas, Gets full quickly at meals, Hemorrhoids, Indigestion, Nausea, Rectal Pain and Vomiting. Female Genitourinary Not Present- Frequency, Nocturia, Painful Urination, Pelvic Pain and Urgency. Musculoskeletal Not Present- Back Pain, Joint Pain, Joint Stiffness, Muscle Pain, Muscle Weakness and Swelling of Extremities. Neurological Not Present- Decreased Memory, Fainting, Headaches, Numbness, Seizures, Tingling, Tremor, Trouble walking and Weakness. Psychiatric Not Present- Anxiety, Bipolar, Change in Sleep Pattern, Depression, Fearful and Frequent crying. Endocrine Not Present- Cold Intolerance, Excessive Hunger, Hair Changes, Heat Intolerance, Hot flashes and New Diabetes. Hematology Not Present- Easy Bruising, Excessive bleeding, Gland problems, HIV and Persistent Infections.  Vitals (Sonya Bynum CMA; 03/15/2015 10:07 AM) 03/15/2015 10:07 AM Weight: 129 lb Height: 65in Body Surface Area: 1.64 m Body Mass Index: 21.47 kg/m  Temp.: 97.70F(Temporal)  Pulse: 76 (Regular)  BP: 128/80 (Sitting, Left Arm, Standard)       Physical Exam (Khadija Thier O. Hulen Skains MD; 03/15/2015 10:41 AM) General Mental Status-Alert. General Appearance-Well groomed. Orientation-Oriented X4. Build & Nutrition-Asthenic and Well nourished.  Abdomen Inspection Hernias - Umbilical hernia - Reducible(reducible but  tender). Umbilicus - Note: Easily reducible, but tender umbilical hernia at the base of the umbilicus.    Assessment & Plan Jeneen Rinks O. Varshini Arrants MD; [id number] AM) UMBILICAL HERNIA WITHOUT OBSTRUCTION AND WITHOUT GANGRENE (K42.9) Story: Seen over one year ago with an asymtptomatic umbilical hernia. Now  symptomatic Impression: Easily reducible but tender. Becasue of increasing frequency of symptoms, patient wants repair. Current Plans Pt Education - Pamphlet Given - Hernia Surgery: discussed with patient and provided information. You are being scheduled for surgery - Our schedulers will call you.  You should hear from our office's scheduling department within 5 working days about the location, date, and time of surgery. We try to make accommodations for patient's preferences in scheduling surgery, but sometimes the OR schedule or the surgeon's schedule prevents [place] from making those accommodations.  If you have not heard from our office [phone]) in 5 working days, call the office and ask for your surgeon's nurse.  If you have other questions about your diagnosis, plan, or surgery, call the office and ask for your surgeon's nurse.  The anatomy & physiology of the abdominal wall was discussed. The pathophysiology of hernias was discussed. Natural history risks without surgery including progeressive enlargement, pain, incarceration, & strangulation was discussed. Contributors to complications such as smoking, obesity, diabetes, prior surgery, etc were discussed.  I feel the risks of no intervention will lead to serious problems that outweigh the operative risks; therefore, I recommended surgery to reduce and repair the hernia. I explained laparoscopic techniques with possible need for an open approach. I noted the probable use of mesh to patch and/or buttress the hernia repair  Risks such as bleeding, infection, abscess, need for further treatment, heart attack, death, and other risks were  discussed. I noted a good likelihood this will help address the problem. Goals of post-operative recovery were discussed as well. Possibility that this will not correct all symptoms was explained. I stressed the importance of low-impact activity, aggressive pain control, avoiding constipation, & not pushing through pain to minimize risk of post-operative chronic pain or injury. Possibility of reherniation especially with smoking, obesity, diabetes, immunosuppression, and other health conditions was discussed. We will work to minimize complications.  An educational handout further explaining the pathology & treatment options was given as well. Questions were answered. The patient expresses understanding & wishes to proceed with surgery.  Pt Education - Hernia: discussed with patient and provided information.  Addendum:  Patient is no more symptomatic.  Ready for surgery.  Carolyn Ferguson. Dahlia Bailiff, MD, [place] [phone] [facility]

## 2015-05-02 ENCOUNTER — Encounter (HOSPITAL_BASED_OUTPATIENT_CLINIC_OR_DEPARTMENT_OTHER): Payer: Self-pay | Admitting: Anesthesiology

## 2015-05-02 ENCOUNTER — Ambulatory Visit (HOSPITAL_BASED_OUTPATIENT_CLINIC_OR_DEPARTMENT_OTHER): Payer: BLUE CROSS/BLUE SHIELD | Admitting: Certified Registered"

## 2015-05-02 ENCOUNTER — Ambulatory Visit (HOSPITAL_BASED_OUTPATIENT_CLINIC_OR_DEPARTMENT_OTHER)
Admission: RE | Admit: 2015-05-02 | Discharge: 2015-05-02 | Disposition: A | Discharge status: Home / Self Care | Payer: BLUE CROSS/BLUE SHIELD | Source: Ambulatory Visit | Attending: General Surgery | Admitting: General Surgery

## 2015-05-02 ENCOUNTER — Encounter (HOSPITAL_BASED_OUTPATIENT_CLINIC_OR_DEPARTMENT_OTHER)
Admission: RE | Disposition: A | Discharge status: Home / Self Care | Payer: Self-pay | Source: Ambulatory Visit | Attending: General Surgery

## 2015-05-02 DIAGNOSIS — K429 Umbilical hernia without obstruction or gangrene: Secondary | ICD-10-CM | POA: Insufficient documentation

## 2015-05-02 HISTORY — DX: Umbilical hernia without obstruction or gangrene: K42.9

## 2015-05-02 HISTORY — PX: UMBILICAL HERNIA REPAIR: SHX196

## 2015-05-02 SURGERY — REPAIR, HERNIA, UMBILICAL, ADULT
Anesthesia: General | Site: Abdomen

## 2015-05-02 MED ORDER — CEFAZOLIN SODIUM-DEXTROSE 2-3 GM-% IV SOLR
2.0000 g | INTRAVENOUS | Status: AC
  Administered 2015-05-02: 2 g via INTRAVENOUS

## 2015-05-02 MED ORDER — PHENYLEPHRINE 40 MCG/ML (10ML) SYRINGE FOR IV PUSH (FOR BLOOD PRESSURE SUPPORT)
PREFILLED_SYRINGE | INTRAVENOUS | Status: AC
  Filled 2015-05-02: qty 10

## 2015-05-02 MED ORDER — LACTATED RINGERS IV SOLN
INTRAVENOUS | Status: DC
  Administered 2015-05-02 (×2): via INTRAVENOUS

## 2015-05-02 MED ORDER — LIDOCAINE HCL (CARDIAC) 20 MG/ML IV SOLN
INTRAVENOUS | Status: DC | PRN
  Administered 2015-05-02: 80 mg via INTRAVENOUS

## 2015-05-02 MED ORDER — FENTANYL CITRATE (PF) 100 MCG/2ML IJ SOLN: 25.0000 ug | INTRAMUSCULAR | Status: DC | PRN

## 2015-05-02 MED ORDER — CHLORHEXIDINE GLUCONATE 4 % EX LIQD: 1.0000 "application " | Freq: Once | CUTANEOUS | Status: DC

## 2015-05-02 MED ORDER — BUPIVACAINE HCL (PF) 0.5 % IJ SOLN
INTRAMUSCULAR | Status: DC | PRN
  Administered 2015-05-02: 8 mL

## 2015-05-02 MED ORDER — GLYCOPYRROLATE 0.2 MG/ML IJ SOLN: 0.2000 mg | Freq: Once | INTRAMUSCULAR | Status: DC | PRN

## 2015-05-02 MED ORDER — FENTANYL CITRATE (PF) 100 MCG/2ML IJ SOLN
INTRAMUSCULAR | Status: AC
  Filled 2015-05-02: qty 2

## 2015-05-02 MED ORDER — LIDOCAINE HCL 4 % EX SOLN
CUTANEOUS | Status: DC | PRN
  Administered 2015-05-02: 3 mL via TOPICAL

## 2015-05-02 MED ORDER — DEXAMETHASONE SODIUM PHOSPHATE 4 MG/ML IJ SOLN
INTRAMUSCULAR | Status: DC | PRN
  Administered 2015-05-02: 10 mg via INTRAVENOUS

## 2015-05-02 MED ORDER — BUPIVACAINE HCL (PF) 0.5 % IJ SOLN
INTRAMUSCULAR | Status: AC
  Filled 2015-05-02: qty 30

## 2015-05-02 MED ORDER — SUGAMMADEX SODIUM 200 MG/2ML IV SOLN
INTRAVENOUS | Status: DC | PRN
  Administered 2015-05-02: 150 mg via INTRAVENOUS

## 2015-05-02 MED ORDER — CEFAZOLIN SODIUM-DEXTROSE 2-3 GM-% IV SOLR
INTRAVENOUS | Status: AC
  Filled 2015-05-02: qty 50

## 2015-05-02 MED ORDER — ROCURONIUM BROMIDE 50 MG/5ML IV SOLN
INTRAVENOUS | Status: AC
  Filled 2015-05-02: qty 1

## 2015-05-02 MED ORDER — HYDROCODONE-ACETAMINOPHEN 5-325 MG PO TABS: 1.0000 | ORAL_TABLET | ORAL | Status: DC | PRN

## 2015-05-02 MED ORDER — SCOPOLAMINE 1 MG/3DAYS TD PT72: 1.0000 | MEDICATED_PATCH | Freq: Once | TRANSDERMAL | Status: DC | PRN

## 2015-05-02 MED ORDER — ONDANSETRON HCL 4 MG/2ML IJ SOLN
INTRAMUSCULAR | Status: AC
  Filled 2015-05-02: qty 2

## 2015-05-02 MED ORDER — DEXAMETHASONE SODIUM PHOSPHATE 10 MG/ML IJ SOLN
INTRAMUSCULAR | Status: AC
  Filled 2015-05-02: qty 1

## 2015-05-02 MED ORDER — PROPOFOL 10 MG/ML IV BOLUS
INTRAVENOUS | Status: DC | PRN
  Administered 2015-05-02: 80 mg via INTRAVENOUS

## 2015-05-02 MED ORDER — MIDAZOLAM HCL 2 MG/2ML IJ SOLN
INTRAMUSCULAR | Status: AC
  Filled 2015-05-02: qty 2

## 2015-05-02 MED ORDER — MIDAZOLAM HCL 2 MG/2ML IJ SOLN
1.0000 mg | INTRAMUSCULAR | Status: DC | PRN
  Administered 2015-05-02: 2 mg via INTRAVENOUS

## 2015-05-02 MED ORDER — ROCURONIUM BROMIDE 100 MG/10ML IV SOLN
INTRAVENOUS | Status: DC | PRN
  Administered 2015-05-02: 25 mg via INTRAVENOUS

## 2015-05-02 MED ORDER — FENTANYL CITRATE (PF) 100 MCG/2ML IJ SOLN
50.0000 ug | INTRAMUSCULAR | Status: DC | PRN
  Administered 2015-05-02: 100 ug via INTRAVENOUS
  Administered 2015-05-02: 50 ug via INTRAVENOUS

## 2015-05-02 MED ORDER — LIDOCAINE HCL (CARDIAC) 20 MG/ML IV SOLN
INTRAVENOUS | Status: AC
  Filled 2015-05-02: qty 5

## 2015-05-02 MED ORDER — LIDOCAINE HCL (PF) 1 % IJ SOLN
INTRAMUSCULAR | Status: AC
  Filled 2015-05-02: qty 30

## 2015-05-02 MED ORDER — SODIUM BICARBONATE 4 % IV SOLN
INTRAVENOUS | Status: AC
  Filled 2015-05-02: qty 5

## 2015-05-02 MED ORDER — PROMETHAZINE HCL 25 MG/ML IJ SOLN: 6.2500 mg | INTRAMUSCULAR | Status: DC | PRN

## 2015-05-02 MED ORDER — ONDANSETRON HCL 4 MG/2ML IJ SOLN
INTRAMUSCULAR | Status: DC | PRN
  Administered 2015-05-02: 4 mg via INTRAVENOUS

## 2015-05-02 MED ORDER — KETOROLAC TROMETHAMINE 30 MG/ML IJ SOLN
INTRAMUSCULAR | Status: DC | PRN
  Administered 2015-05-02: 30 mg via INTRAVENOUS

## 2015-05-02 SURGICAL SUPPLY — 62 items
BAG DECANTER FOR FLEXI CONT (MISCELLANEOUS) ×3 IMPLANT
BINDER ABDOMINAL 12 SM 30-45 (SOFTGOODS) IMPLANT
BLADE CLIPPER SURG (BLADE) IMPLANT
BLADE SURG 10 STRL SS (BLADE) ×3 IMPLANT
BLADE SURG 15 STRL LF DISP TIS (BLADE) ×2 IMPLANT
BLADE SURG 15 STRL SS (BLADE) ×3
CANISTER SUCT 1200ML W/VALVE (MISCELLANEOUS) IMPLANT
CHLORAPREP W/TINT 26ML (MISCELLANEOUS) ×3 IMPLANT
CLEANER CAUTERY TIP 5X5 PAD (MISCELLANEOUS) ×2 IMPLANT
CONT SPEC 4OZ CLIKSEAL STRL BL (MISCELLANEOUS) IMPLANT
COVER BACK TABLE 60X90IN (DRAPES) ×3 IMPLANT
COVER MAYO STAND STRL (DRAPES) ×3 IMPLANT
DECANTER SPIKE VIAL GLASS SM (MISCELLANEOUS) ×3 IMPLANT
DRAPE LAPAROTOMY TRNSV 102X78 (DRAPE) ×3 IMPLANT
DRAPE UTILITY XL STRL (DRAPES) ×4 IMPLANT
DRSG TEGADERM 2-3/8X2-3/4 SM (GAUZE/BANDAGES/DRESSINGS) IMPLANT
DRSG TEGADERM 4X4.75 (GAUZE/BANDAGES/DRESSINGS) ×3 IMPLANT
ELECT REM PT RETURN 9FT ADLT (ELECTROSURGICAL) ×3
ELECTRODE REM PT RTRN 9FT ADLT (ELECTROSURGICAL) ×2 IMPLANT
GLOVE BIOGEL PI IND STRL 7.0 (GLOVE) ×4 IMPLANT
GLOVE BIOGEL PI IND STRL 8 (GLOVE) ×2 IMPLANT
GLOVE BIOGEL PI INDICATOR 7.0 (GLOVE) ×2
GLOVE BIOGEL PI INDICATOR 8 (GLOVE) ×1
GLOVE ECLIPSE 6.5 STRL STRAW (GLOVE) ×2 IMPLANT
GLOVE ECLIPSE 7.5 STRL STRAW (GLOVE) ×3 IMPLANT
GOWN STRL REUS W/ TWL LRG LVL3 (GOWN DISPOSABLE) IMPLANT
GOWN STRL REUS W/TWL LRG LVL3 (GOWN DISPOSABLE)
LIQUID BAND (GAUZE/BANDAGES/DRESSINGS) ×3 IMPLANT
NDL HYPO 25X1 1.5 SAFETY (NEEDLE) ×1 IMPLANT
NEEDLE HYPO 25X1 1.5 SAFETY (NEEDLE) ×3 IMPLANT
NS IRRIG 1000ML POUR BTL (IV SOLUTION) ×3 IMPLANT
PACK BASIN DAY SURGERY FS (CUSTOM PROCEDURE TRAY) ×3 IMPLANT
PAD CLEANER CAUTERY TIP 5X5 (MISCELLANEOUS) ×1
PENCIL BUTTON HOLSTER BLD 10FT (ELECTRODE) ×3 IMPLANT
SLEEVE SCD COMPRESS KNEE MED (MISCELLANEOUS) ×2 IMPLANT
SPONGE INTESTINAL PEANUT (DISPOSABLE) IMPLANT
SPONGE LAP 4X18 X RAY DECT (DISPOSABLE) ×3 IMPLANT
STAPLER VISISTAT 35W (STAPLE) IMPLANT
STRIP CLOSURE SKIN 1/2X4 (GAUZE/BANDAGES/DRESSINGS) ×3 IMPLANT
SUT ETHIBOND 0 MO6 C/R (SUTURE) IMPLANT
SUT MNCRL AB 4-0 PS2 18 (SUTURE) ×3 IMPLANT
SUT MON AB 5-0 P3 18 (SUTURE) ×2 IMPLANT
SUT NOVA NAB DX-16 0-1 5-0 T12 (SUTURE) IMPLANT
SUT PROLENE 0 CT 2 (SUTURE) IMPLANT
SUT PROLENE 1 CT (SUTURE) IMPLANT
SUT VIC AB 3-0 FS2 27 (SUTURE) IMPLANT
SUT VIC AB 3-0 SH 27 (SUTURE) ×3
SUT VIC AB 3-0 SH 27X BRD (SUTURE) ×2 IMPLANT
SUT VIC AB 4-0 RB1 27 (SUTURE)
SUT VIC AB 4-0 RB1 27X BRD (SUTURE) IMPLANT
SUT VIC AB 4-0 SH 27 (SUTURE) ×3
SUT VIC AB 4-0 SH 27XANBCTRL (SUTURE) ×1 IMPLANT
SUT VIC AB 5-0 PS2 18 (SUTURE) IMPLANT
SUT VICRYL 4-0 PS2 18IN ABS (SUTURE) IMPLANT
SUT VICRYL AB 2 0 TIE (SUTURE) IMPLANT
SUT VICRYL AB 2 0 TIES (SUTURE)
SYR BULB 3OZ (MISCELLANEOUS) IMPLANT
SYR CONTROL 10ML LL (SYRINGE) ×3 IMPLANT
TOWEL OR 17X24 6PK STRL BLUE (TOWEL DISPOSABLE) ×3 IMPLANT
TOWEL OR NON WOVEN STRL DISP B (DISPOSABLE) ×3 IMPLANT
TUBE CONNECTING 20X1/4 (TUBING) IMPLANT
YANKAUER SUCT BULB TIP NO VENT (SUCTIONS) IMPLANT

## 2015-05-02 NOTE — Anesthesia Preprocedure Evaluation (Addendum)
Anesthesia Evaluation  Patient identified by MRN, date of birth, ID band Patient awake    Reviewed: Allergy & Precautions, NPO status , Patient's Chart, lab work & pertinent test results  Airway Mallampati: II  TM Distance: >3 FB Neck ROM: Full    Dental no notable dental hx.    Pulmonary neg pulmonary ROS,    Pulmonary exam normal breath sounds clear to auscultation       Cardiovascular negative cardio ROS Normal cardiovascular exam Rhythm:Regular Rate:Normal     Neuro/Psych negative neurological ROS  negative psych ROS   GI/Hepatic negative GI ROS, Neg liver ROS,   Endo/Other  negative endocrine ROS  Renal/GU negative Renal ROS  negative genitourinary   Musculoskeletal negative musculoskeletal ROS (+)   Abdominal   Peds negative pediatric ROS (+)  Hematology negative hematology ROS (+)   Anesthesia Other Findings   Reproductive/Obstetrics negative OB ROS                             Anesthesia Physical Anesthesia Plan  ASA: II  Anesthesia Plan: General   Post-op Pain Management:    Induction: Intravenous  Airway Management Planned: Oral ETT  Additional Equipment:   Intra-op Plan:   Post-operative Plan: Extubation in OR  Informed Consent: I have reviewed the patients History and Physical, chart, labs and discussed the procedure including the risks, benefits and alternatives for the proposed anesthesia with the patient or authorized representative who has indicated his/her understanding and acceptance.   Dental advisory given  Plan Discussed with: CRNA  Anesthesia Plan Comments:         Anesthesia Quick Evaluation  

## 2015-05-02 NOTE — Op Note (Signed)
OPERATIVE REPORT  DATE OF OPERATION: 05/02/2015  PATIENT:  Carolyn Ferguson  45 y.o. female  PRE-OPERATIVE DIAGNOSIS:  Symptomatic umbilical hernia  POST-OPERATIVE DIAGNOSIS:  Symptomatic umbilical hernia  PROCEDURE:  Procedure(s):  UMBILICAL HERNIA REPAIR WITHOUT MESH  SURGEON:  Surgeon(s): Judeth Horn, MD  ASSISTANT: None  ANESTHESIA:   general  EBL: <20 ml  BLOOD ADMINISTERED: none  DRAINS: none   SPECIMEN:  No Specimen  COUNTS CORRECT:  YES  PROCEDURE DETAILS: The patient was taken to the operating room and placed on the table in the supine position. After an adequate general endotracheal anesthetic was administered she was prepped and draped in usual sterile manner exposing her periumbilical area.  A proper timeout was performed identifying the patient and procedure to be performed. An inferior to and have a 4 cm infraumbilical incision was made using a #15 blade. Subsequently dissected down to the fascia through the subcutaneous tissue using electrocautery. The patient had a hernia sac which was attached to the base of the umbilicus. As we dissected away the hernia sac from the umbilicus a small buttonhole was made in the umbilicus which we subsequently repaired. We then went on to dissect out the hernia sac. We excised the sac at the fascial border creating what was expected to be approximately a 3 cm fascial defect.  Care was taken to examine and stay away from the bowel intra-abdominally. It was decided that the umbilical defect did not require the placement of mesh. Under direct vision and making sure that none of the bowel entered into the repair or  was injured, figure-of-eight stitches of #1 Novafil were placed. A total of 5 figure-of-eight interrupted #1 Novafil sutures were used to repair the umbilical defect. Once this was done we irrigated with saline and the deep subcutaneous tissue with 3-0 Vicryl interrupted sutures. The buttonhole in the umbilicus was repaired  internally with 4-0 Vicryl subcuticular stitch and at the skin level interrupted 5-0 Monocryl sutures. We then reapproximated the skin after injecting in the incision with 0.5% Marcaine using a running subcuticular stitch of 4-0 Monocryl. Dermabond Steri-Strips and Tegaderm and a rolled up 2 x 2 placed in the umbilicus to keep it inverted were used to complete the dressing. All needle counts, sponge counts, and instrument counts were correct.  PATIENT DISPOSITION:  PACU - hemodynamically stable.   Glyn Gerads 3/13/201710:31 AM

## 2015-05-02 NOTE — Discharge Instructions (Addendum)
Umbilical Herniorrhaphy, Care After Refer to this sheet in the next few weeks. These instructions provide you with information on caring for yourself after your procedure. Your health care provider may also give you more specific instructions. Your treatment has been planned according to current medical practices, but problems sometimes occur. Call your health care provider if you have any problems or questions after your procedure.  HOME CARE INSTRUCTIONS  If you are given antibiotic medicine, take it as directed. Finish it even if you start to feel better.  Only take over-the-counter or prescription medicines for pain, fever, or discomfort as directed by your health care provider. Do not take aspirin. It can cause bleeding.  Do not get your surgical cut (incision) area wet unless your health care provider says it is okay.  Yours is covered with Plastic you can shower right away.  Avoid lifting objects heavier than 10 lb (4.5 kg) for 8 weeks after surgery.  Avoid sexual activity for 5 weeks after surgery or as directed by your health care provider.  Do not drive while taking prescription pain medicine.  You may return to your other normal, daily activities after 3 days or as directed by your health care provider.  Please wear abdominal binder at all time when up and about for the next three weeks.  SEEK MEDICAL CARE IF:  You notice blood or fluid leaking from the surgical site.  Your incision area becomes red or swollen.  Your pain at the surgical site becomes worse or is not relieved by medicine.  You have problems urinating.  You feel nauseous or vomit more than 2 days after surgery.  You notice the bulge in your abdomen returns after the procedure.  SEEK IMMEDIATE MEDICAL CARE IF:  You have a fever.  You have nausea or vomiting that will not stop.   This information is not intended to replace advice given to you by your health care provider. Make sure you discuss any  questions you have with your health care provider.   Document Released: 08/07/2011 Document Revised: 02/26/2014 Document Reviewed: 08/07/2011 Elsevier Interactive Patient Education 2016 [facility] Care Instructions  Activity: Get plenty of rest for the remainder of the day. A responsible adult should stay with you for 24 hours following the procedure.  For the next 24 hours, DO NOT: -Drive a car -Paediatric nurse -Drink alcoholic beverages -Take any medication unless instructed by your physician -Make any legal decisions or sign important papers.  Meals: Start with liquid foods such as gelatin or soup. Progress to regular foods as tolerated. Avoid greasy, spicy, heavy foods. If nausea and/or vomiting occur, drink only clear liquids until the nausea and/or vomiting subsides. Call your physician if vomiting continues.  Special Instructions/Symptoms: Your throat may feel dry or sore from the anesthesia or the breathing tube placed in your throat during surgery. If this causes discomfort, gargle with warm salt water. The discomfort should disappear within 24 hours.  If you had a scopolamine patch placed behind your ear for the management of post- operative nausea and/or vomiting:  1. The medication in the patch is effective for 72 hours, after which it should be removed.  Wrap patch in a tissue and discard in the trash. Wash hands thoroughly with soap and water. 2. You may remove the patch earlier than 72 hours if you experience unpleasant side effects which may include dry mouth, dizziness or visual disturbances. 3. Avoid touching the patch. Wash your hands  with soap and water after contact with the patch.

## 2015-05-02 NOTE — Anesthesia Postprocedure Evaluation (Signed)
Anesthesia Post Note  Patient: Carolyn Ferguson  Procedure(s) Performed: Procedure(s) (LRB):  UMBILICAL HERNIA REPAIR (N/A)  Patient location during evaluation: PACU Anesthesia Type: General Level of consciousness: awake and alert Pain management: pain level controlled Vital Signs Assessment: post-procedure vital signs reviewed and stable Respiratory status: spontaneous breathing, nonlabored ventilation, respiratory function stable and patient connected to nasal cannula oxygen Cardiovascular status: blood pressure returned to baseline and stable Postop Assessment: no signs of nausea or vomiting Anesthetic complications: no    Last Vitals:  Filed Vitals:   05/02/15 1130 05/02/15 1150  BP: 104/55 130/86  Pulse: 50 61  Temp:  36.5 C  Resp: 15 18    Last Pain:  Filed Vitals:   05/02/15 1154  PainSc: 2                  [name]

## 2015-05-02 NOTE — Anesthesia Procedure Notes (Signed)
Procedure Name: Intubation Date/Time: 05/02/2015 9:17 AM Performed by: Maryella Shivers Pre-anesthesia Checklist: Patient identified, Emergency Drugs available, Suction available and Patient being monitored Patient Re-evaluated:Patient Re-evaluated prior to inductionOxygen Delivery Method: Circle System Utilized Preoxygenation: Pre-oxygenation with 100% oxygen Intubation Type: IV induction Ventilation: Mask ventilation without difficulty Laryngoscope Size: Mac and 3 Grade View: Grade I Tube type: Oral Tube size: 7.0 mm Number of attempts: 1 Airway Equipment and Method: Stylet and Oral airway Placement Confirmation: ETT inserted through vocal cords under direct vision,  positive ETCO2 and breath sounds checked- equal and bilateral Secured at: 20 cm Tube secured with: Tape Dental Injury: Teeth and Oropharynx as per pre-operative assessment

## 2015-05-02 NOTE — Transfer of Care (Signed)
Immediate Anesthesia Transfer of Care Note  Patient: Carolyn Ferguson  Procedure(s) Performed: Procedure(s):  UMBILICAL HERNIA REPAIR (N/A)  Patient Location: PACU  Anesthesia Type:General  Level of Consciousness: sedated  Airway & Oxygen Therapy: Patient Spontanous Breathing and Patient connected to face mask oxygen  Post-op Assessment: Post -op Vital signs reviewed and stable and Post -op Vital signs reviewed and unstable, Anesthesiologist notified  Post vital signs: Reviewed and stable  Last Vitals:  Filed Vitals:   05/02/15 0759  BP: 100/61  Pulse: 65  Temp: 36.9 C  Resp: 20    Complications: No apparent anesthesia complications

## 2015-05-03 ENCOUNTER — Encounter (HOSPITAL_BASED_OUTPATIENT_CLINIC_OR_DEPARTMENT_OTHER): Payer: Self-pay | Admitting: General Surgery

## 2015-06-02 DIAGNOSIS — R300 Dysuria: Secondary | ICD-10-CM | POA: Diagnosis not present

## 2015-09-06 ENCOUNTER — Other Ambulatory Visit: Payer: Self-pay | Admitting: Obstetrics & Gynecology

## 2015-09-06 DIAGNOSIS — Z1231 Encounter for screening mammogram for malignant neoplasm of breast: Secondary | ICD-10-CM

## 2015-09-16 ENCOUNTER — Ambulatory Visit
Admission: RE | Admit: 2015-09-16 | Discharge: 2015-09-16 | Disposition: A | Discharge status: Home / Self Care | Payer: BLUE CROSS/BLUE SHIELD | Source: Ambulatory Visit | Attending: Obstetrics & Gynecology | Admitting: Obstetrics & Gynecology

## 2015-09-16 DIAGNOSIS — Z1231 Encounter for screening mammogram for malignant neoplasm of breast: Secondary | ICD-10-CM

## 2016-01-03 DIAGNOSIS — Z6822 Body mass index (BMI) 22.0-22.9, adult: Secondary | ICD-10-CM | POA: Diagnosis not present

## 2016-01-03 DIAGNOSIS — Z01419 Encounter for gynecological examination (general) (routine) without abnormal findings: Secondary | ICD-10-CM | POA: Diagnosis not present

## 2016-01-03 DIAGNOSIS — Z23 Encounter for immunization: Secondary | ICD-10-CM | POA: Diagnosis not present

## 2016-04-09 DIAGNOSIS — Z23 Encounter for immunization: Secondary | ICD-10-CM | POA: Diagnosis not present

## 2016-05-22 DIAGNOSIS — M79672 Pain in left foot: Secondary | ICD-10-CM | POA: Diagnosis not present

## 2016-05-22 DIAGNOSIS — D2372 Other benign neoplasm of skin of left lower limb, including hip: Secondary | ICD-10-CM | POA: Diagnosis not present

## 2016-05-29 DIAGNOSIS — B070 Plantar wart: Secondary | ICD-10-CM | POA: Diagnosis not present

## 2016-05-29 DIAGNOSIS — D2372 Other benign neoplasm of skin of left lower limb, including hip: Secondary | ICD-10-CM | POA: Diagnosis not present

## 2016-06-05 DIAGNOSIS — D2372 Other benign neoplasm of skin of left lower limb, including hip: Secondary | ICD-10-CM | POA: Diagnosis not present

## 2016-09-16 ENCOUNTER — Encounter (HOSPITAL_COMMUNITY): Payer: Self-pay | Admitting: *Deleted

## 2016-09-16 ENCOUNTER — Encounter (HOSPITAL_COMMUNITY): Payer: Self-pay | Admitting: Obstetrics & Gynecology

## 2016-09-16 ENCOUNTER — Other Ambulatory Visit (HOSPITAL_COMMUNITY): Payer: Self-pay | Admitting: Obstetrics & Gynecology

## 2016-09-16 ENCOUNTER — Inpatient Hospital Stay (HOSPITAL_COMMUNITY): Payer: BLUE CROSS/BLUE SHIELD

## 2016-09-16 ENCOUNTER — Inpatient Hospital Stay (HOSPITAL_COMMUNITY)
Admission: AD | Admit: 2016-09-16 | Discharge: 2016-09-16 | Disposition: A | Discharge status: Home / Self Care | Payer: BLUE CROSS/BLUE SHIELD | Source: Ambulatory Visit | Attending: Obstetrics & Gynecology | Admitting: Obstetrics & Gynecology

## 2016-09-16 DIAGNOSIS — N83201 Unspecified ovarian cyst, right side: Secondary | ICD-10-CM | POA: Diagnosis not present

## 2016-09-16 DIAGNOSIS — Z975 Presence of (intrauterine) contraceptive device: Secondary | ICD-10-CM | POA: Diagnosis not present

## 2016-09-16 DIAGNOSIS — R102 Pelvic and perineal pain: Secondary | ICD-10-CM

## 2016-09-16 DIAGNOSIS — Z79899 Other long term (current) drug therapy: Secondary | ICD-10-CM | POA: Insufficient documentation

## 2016-09-16 DIAGNOSIS — N809 Endometriosis, unspecified: Secondary | ICD-10-CM | POA: Insufficient documentation

## 2016-09-16 DIAGNOSIS — R1031 Right lower quadrant pain: Secondary | ICD-10-CM | POA: Diagnosis not present

## 2016-09-16 HISTORY — DX: Pelvic and perineal pain: R10.2

## 2016-09-16 LAB — URINALYSIS, ROUTINE W REFLEX MICROSCOPIC
BILIRUBIN URINE: NEGATIVE
Glucose, UA: NEGATIVE mg/dL
Hgb urine dipstick: NEGATIVE
Ketones, ur: 5 mg/dL — AB
LEUKOCYTES UA: NEGATIVE
NITRITE: NEGATIVE
PH: 7 (ref 5.0–8.0)
Protein, ur: NEGATIVE mg/dL
SPECIFIC GRAVITY, URINE: 1.018 (ref 1.005–1.030)

## 2016-09-16 MED ORDER — IBUPROFEN 600 MG PO TABS: 600.0000 mg | ORAL_TABLET | Freq: Four times a day (QID) | ORAL | 0 refills | Status: AC | PRN

## 2016-09-16 MED ORDER — OXYCODONE-ACETAMINOPHEN 5-325 MG PO TABS: 1.0000 | ORAL_TABLET | Freq: Four times a day (QID) | ORAL | 0 refills | Status: AC | PRN

## 2016-09-16 MED ORDER — KETOROLAC TROMETHAMINE 30 MG/ML IJ SOLN
30.0000 mg | Freq: Once | INTRAMUSCULAR | Status: AC
  Administered 2016-09-16: 30 mg via INTRAMUSCULAR
  Filled 2016-09-16: qty 1

## 2016-09-16 NOTE — MAU Provider Note (Signed)
  History     CSN: [id number]  Arrival date and time: 09/16/16 1044   None     Chief Complaint  Patient presents with  . Abdominal Pain   HPI RLQ acute pain since yesterday AM, worsening a bit more. Was at the beach and decided to wait to come back and get evaluation here. No fever/ nausea/vomiting. Tried Ibuprofen 400mg  x2 and 600mg  at night with minimal relief Known endometriosis hx. Has Mirena for endometriosis and no menses but is getting ovulatory cysts off and on since 7-8 mos. OCs suppression was reviewed at office visit but she was trying to avoid if possible.   Past Medical History:  Diagnosis Date  . Endometriosis   . IUD (intrauterine device) in place    MIRENA    . Ovarian cyst   . Pelvic pain 09/16/2016  . Umbilical hernia     Past Surgical History:  Procedure Laterality Date  . CESAREAN SECTION     with right salpingectomy  . PELVIC LAPAROSCOPY     DX LAP W LASER VAP OF ENDOMETRIOSIS--LEFT SALPINGECTOMY  . UMBILICAL HERNIA REPAIR N/A 05/02/2015   Procedure:  UMBILICAL HERNIA REPAIR;  Surgeon: Judeth Horn, MD;  Location: [facility];  Service: General;  Laterality: N/A;    Family History  Problem Relation Age of Onset  . Diabetes Father   . Heart disease Father   . Heart disease Paternal Uncle     Social History  Substance Use Topics  . Smoking status: Never Smoker  . Smokeless tobacco: Never Used  . Alcohol use 0.6 oz/week    1 Glasses of wine per week     Comment: social    Allergies: No Known Allergies  Prescriptions Prior to Admission  Medication Sig Dispense Refill Last Dose  . cholecalciferol (VITAMIN D) 1000 units tablet Take 1,000 Units by mouth daily.   Past Week at Unknown time  . levonorgestrel (MIRENA) 20 MCG/24HR IUD 1 each by Intrauterine route once.    02/27/2008 at unknown    Review of Systems Physical Exam   Blood pressure 119/71, pulse (!) 57, temperature 98.2 F (36.8 C), temperature source Oral, resp. rate 16,  SpO2 100 %.  Physical Exam A&O x 3, no acute distress but in pain. Afebrile.  Lungs CTA bilat CV RRR, S1S2 normal Abdo soft, slight tenderness RLQ, non acute but voluntary guarding in RLQ. Active BS all quadrants  Extr no edema/ tenderness Pelvic during pelvic sono- right side pain.    MAU Course  Procedures Pelvic sono done and reviewed with Radiologist Hemorrhagic cyst/ endometrioma/ torsion/ other Size 4.4 cm, hemorrhagic appearance/ possible endometrioma but no clear blood flow to ovary except some peripheral flow, hence possible torsion.   Assessment and Plan  46 yo, here with acute RLQ pain, pelvic sono c/w complex ovarian cyst- hemorrhagic ovary/ endometrioma/ less likely torsion given size and overall clinical presentation.  Toradol 30mg  IM now Rx Percocet as needed Fup office sono in 2 wks.   Yvonnia Tango R 09/16/2016, 12:00 PM

## 2016-09-16 NOTE — MAU Note (Signed)
Pain started Saturday a.m. And worsened throughout the day.  Hx. Of ovarian cysts.

## 2016-09-16 NOTE — Discharge Instructions (Signed)
Ovarian Cyst °An ovarian cyst is a fluid-filled sac on an ovary. The ovaries are organs that make eggs in women. Most ovarian cysts go away on their own and are not cancerous (are benign). Some cysts need treatment. °Follow these instructions at home: °· Take over-the-counter and prescription medicines only as told by your doctor. °· Do not drive or use heavy machinery while taking prescription pain medicine. °· Get pelvic exams and Pap tests as often as told by your doctor. °· Return to your normal activities as told by your doctor. Ask your doctor what activities are safe for you. °· Do not use any products that contain nicotine or tobacco, such as cigarettes and e-cigarettes. If you need help quitting, ask your doctor. °· Keep all follow-up visits as told by your doctor. This is important. °Contact a doctor if: °· Your periods are: °¨ Late. °¨ Irregular. °¨ Painful. °· Your periods stop. °· You have pelvic pain that does not go away. °· You have pressure on your bladder. °· You have trouble making your bladder empty when you pee (urinate). °· You have pain during sex. °· You have any of the following in your belly (abdomen): °¨ A feeling of fullness. °¨ Pressure. °¨ Discomfort. °¨ Pain that does not go away. °¨ Swelling. °· You feel sick most of the time. °· You have trouble pooping (have constipation). °· You are not as hungry as usual (you lose your appetite). °· You get very bad acne. °· You start to have more hair on your body and face. °· You are gaining weight or losing weight without changing your exercise and eating habits. °· You think you may be pregnant. °Get help right away if: °· You have belly pain that is very bad or gets worse. °· You cannot eat or drink without throwing up (vomiting). °· You suddenly get a fever. °· Your period is a lot heavier than usual. °This information is not intended to replace advice given to you by your health care provider. Make sure you discuss any questions you have  with your health care provider. °Document Released: 07/25/2007 Document Revised: 08/26/2015 Document Reviewed: 07/10/2015 °Elsevier Interactive Patient Education © 2017 [employer]vier Inc. ° °

## 2016-09-27 ENCOUNTER — Other Ambulatory Visit: Payer: Self-pay | Admitting: Obstetrics & Gynecology

## 2016-09-27 DIAGNOSIS — Z1231 Encounter for screening mammogram for malignant neoplasm of breast: Secondary | ICD-10-CM

## 2016-10-03 DIAGNOSIS — N83291 Other ovarian cyst, right side: Secondary | ICD-10-CM | POA: Diagnosis not present

## 2016-10-12 ENCOUNTER — Ambulatory Visit
Admission: RE | Admit: 2016-10-12 | Discharge: 2016-10-12 | Disposition: A | Discharge status: Home / Self Care | Payer: BLUE CROSS/BLUE SHIELD | Source: Ambulatory Visit | Attending: Obstetrics & Gynecology | Admitting: Obstetrics & Gynecology

## 2016-10-12 DIAGNOSIS — Z1231 Encounter for screening mammogram for malignant neoplasm of breast: Secondary | ICD-10-CM

## 2017-01-23 DIAGNOSIS — Z23 Encounter for immunization: Secondary | ICD-10-CM | POA: Diagnosis not present

## 2017-02-01 DIAGNOSIS — Z6824 Body mass index (BMI) 24.0-24.9, adult: Secondary | ICD-10-CM | POA: Diagnosis not present

## 2017-02-01 DIAGNOSIS — R0602 Shortness of breath: Secondary | ICD-10-CM | POA: Diagnosis not present

## 2017-02-01 DIAGNOSIS — H6993 Unspecified Eustachian tube disorder, bilateral: Secondary | ICD-10-CM | POA: Diagnosis not present

## 2017-02-01 DIAGNOSIS — H6593 Unspecified nonsuppurative otitis media, bilateral: Secondary | ICD-10-CM | POA: Diagnosis not present

## 2017-02-21 DIAGNOSIS — H9312 Tinnitus, left ear: Secondary | ICD-10-CM | POA: Diagnosis not present

## 2017-02-21 DIAGNOSIS — H9203 Otalgia, bilateral: Secondary | ICD-10-CM | POA: Diagnosis not present

## 2017-03-04 DIAGNOSIS — Z6823 Body mass index (BMI) 23.0-23.9, adult: Secondary | ICD-10-CM | POA: Diagnosis not present

## 2017-03-04 DIAGNOSIS — Z1151 Encounter for screening for human papillomavirus (HPV): Secondary | ICD-10-CM | POA: Diagnosis not present

## 2017-03-04 DIAGNOSIS — Z1329 Encounter for screening for other suspected endocrine disorder: Secondary | ICD-10-CM | POA: Diagnosis not present

## 2017-03-04 DIAGNOSIS — Z01419 Encounter for gynecological examination (general) (routine) without abnormal findings: Secondary | ICD-10-CM | POA: Diagnosis not present

## 2017-03-04 DIAGNOSIS — Z131 Encounter for screening for diabetes mellitus: Secondary | ICD-10-CM | POA: Diagnosis not present

## 2017-03-04 DIAGNOSIS — Z13220 Encounter for screening for lipoid disorders: Secondary | ICD-10-CM | POA: Diagnosis not present

## 2017-03-04 DIAGNOSIS — Z0000 Encounter for general adult medical examination without abnormal findings: Secondary | ICD-10-CM | POA: Diagnosis not present

## 2017-03-04 DIAGNOSIS — Z130 Encounter for screening for diseases of the blood and blood-forming organs and certain disorders involving the immune mechanism: Secondary | ICD-10-CM | POA: Diagnosis not present

## 2017-03-05 ENCOUNTER — Other Ambulatory Visit: Payer: Self-pay | Admitting: Obstetrics & Gynecology

## 2017-03-05 DIAGNOSIS — N6310 Unspecified lump in the right breast, unspecified quadrant: Secondary | ICD-10-CM

## 2017-03-15 ENCOUNTER — Ambulatory Visit
Admission: RE | Admit: 2017-03-15 | Discharge: 2017-03-15 | Disposition: A | Discharge status: Home / Self Care | Payer: BLUE CROSS/BLUE SHIELD | Source: Ambulatory Visit | Attending: Obstetrics & Gynecology | Admitting: Obstetrics & Gynecology

## 2017-03-15 DIAGNOSIS — N6311 Unspecified lump in the right breast, upper outer quadrant: Secondary | ICD-10-CM | POA: Diagnosis not present

## 2017-03-15 DIAGNOSIS — N6310 Unspecified lump in the right breast, unspecified quadrant: Secondary | ICD-10-CM

## 2017-03-15 DIAGNOSIS — R922 Inconclusive mammogram: Secondary | ICD-10-CM | POA: Diagnosis not present

## 2017-08-01 DIAGNOSIS — Z30433 Encounter for removal and reinsertion of intrauterine contraceptive device: Secondary | ICD-10-CM | POA: Diagnosis not present

## 2017-12-18 ENCOUNTER — Other Ambulatory Visit: Payer: Self-pay | Admitting: Obstetrics & Gynecology

## 2017-12-18 DIAGNOSIS — Z1231 Encounter for screening mammogram for malignant neoplasm of breast: Secondary | ICD-10-CM

## 2018-01-01 DIAGNOSIS — Z23 Encounter for immunization: Secondary | ICD-10-CM | POA: Diagnosis not present

## 2018-01-03 DIAGNOSIS — H5213 Myopia, bilateral: Secondary | ICD-10-CM | POA: Diagnosis not present

## 2018-01-03 DIAGNOSIS — H524 Presbyopia: Secondary | ICD-10-CM | POA: Diagnosis not present

## 2018-01-30 ENCOUNTER — Ambulatory Visit
Admission: RE | Admit: 2018-01-30 | Discharge: 2018-01-30 | Disposition: A | Discharge status: Home / Self Care | Payer: BLUE CROSS/BLUE SHIELD | Source: Ambulatory Visit | Attending: Obstetrics & Gynecology | Admitting: Obstetrics & Gynecology

## 2018-01-30 DIAGNOSIS — Z1231 Encounter for screening mammogram for malignant neoplasm of breast: Secondary | ICD-10-CM

## 2019-04-03 ENCOUNTER — Ambulatory Visit: Payer: Self-pay

## 2019-05-02 ENCOUNTER — Ambulatory Visit: Payer: Self-pay | Attending: Internal Medicine

## 2019-05-02 DIAGNOSIS — Z23 Encounter for immunization: Secondary | ICD-10-CM

## 2019-05-02 NOTE — Progress Notes (Signed)
   Covid-19 Vaccination Clinic  Name:  Carolyn Ferguson    MRN: [id number] DOB: [birth year]  05/02/2019  Ms. Min was observed post Covid-19 immunization for 15 minutes without incident. She was provided with Vaccine Information Sheet and instruction to access the V-Safe system.   [name] was instructed to call 911 with any severe reactions post vaccine: Marland Kitchen Difficulty breathing  . Swelling of face and throat  . A fast heartbeat  . A bad rash all over body  . Dizziness and weakness   Immunizations Administered    Name Date Dose VIS Date Route   Pfizer COVID-19 Vaccine 05/02/2019  9:00 AM 0.3 mL 01/30/2019 Intramuscular   Manufacturer: [place]   Lot: [id number]   [place]: [id number]

## 2019-05-15 ENCOUNTER — Ambulatory Visit: Payer: BC Managed Care – PPO | Attending: Internal Medicine

## 2019-05-15 DIAGNOSIS — Z20822 Contact with and (suspected) exposure to covid-19: Secondary | ICD-10-CM | POA: Diagnosis not present

## 2019-05-16 LAB — NOVEL CORONAVIRUS, NAA: SARS-CoV-2, NAA: NOT DETECTED

## 2019-05-16 LAB — SARS-COV-2, NAA 2 DAY TAT

## 2019-05-25 ENCOUNTER — Ambulatory Visit: Payer: Self-pay

## 2019-05-25 ENCOUNTER — Ambulatory Visit: Payer: Self-pay | Attending: Internal Medicine

## 2019-05-25 DIAGNOSIS — Z23 Encounter for immunization: Secondary | ICD-10-CM

## 2019-05-25 NOTE — Progress Notes (Signed)
   Covid-19 Vaccination Clinic  Name:  Enia Vivenzio    MRN: [id number] DOB: June 07, 1970  05/25/2019  Ms. Dilley was observed post Covid-19 immunization for 15 minutes without incident. She was provided with Vaccine Information Sheet and instruction to access the V-Safe system.   Ms. Kristof was instructed to call 911 with any severe reactions post vaccine: Marland Kitchen Difficulty breathing  . Swelling of face and throat  . A fast heartbeat  . A bad rash all over body  . Dizziness and weakness   Immunizations Administered    Name Date Dose VIS Date Route   Pfizer COVID-19 Vaccine 05/25/2019 10:28 AM 0.3 mL 01/30/2019 Intramuscular   Manufacturer: [place]   Lot: [id number]   [place]: [id number]

## 2019-08-11 ENCOUNTER — Other Ambulatory Visit: Payer: Self-pay | Admitting: Obstetrics & Gynecology

## 2019-08-11 DIAGNOSIS — Z1231 Encounter for screening mammogram for malignant neoplasm of breast: Secondary | ICD-10-CM

## 2019-08-12 ENCOUNTER — Ambulatory Visit
Admission: RE | Admit: 2019-08-12 | Discharge: 2019-08-12 | Disposition: A | Discharge status: Home / Self Care | Payer: BC Managed Care – PPO | Source: Ambulatory Visit | Attending: Obstetrics & Gynecology | Admitting: Obstetrics & Gynecology

## 2019-08-12 ENCOUNTER — Other Ambulatory Visit: Payer: Self-pay

## 2019-08-12 DIAGNOSIS — Z1231 Encounter for screening mammogram for malignant neoplasm of breast: Secondary | ICD-10-CM | POA: Diagnosis not present

## 2019-08-13 DIAGNOSIS — H5213 Myopia, bilateral: Secondary | ICD-10-CM | POA: Diagnosis not present

## 2019-08-13 DIAGNOSIS — H524 Presbyopia: Secondary | ICD-10-CM | POA: Diagnosis not present

## 2019-08-18 ENCOUNTER — Ambulatory Visit: Payer: Self-pay

## 2019-11-06 DIAGNOSIS — Z0000 Encounter for general adult medical examination without abnormal findings: Secondary | ICD-10-CM | POA: Diagnosis not present

## 2019-11-06 DIAGNOSIS — Z1159 Encounter for screening for other viral diseases: Secondary | ICD-10-CM | POA: Diagnosis not present

## 2019-11-06 DIAGNOSIS — E559 Vitamin D deficiency, unspecified: Secondary | ICD-10-CM | POA: Diagnosis not present

## 2019-11-11 DIAGNOSIS — Z1211 Encounter for screening for malignant neoplasm of colon: Secondary | ICD-10-CM | POA: Diagnosis not present

## 2019-11-11 DIAGNOSIS — Z0000 Encounter for general adult medical examination without abnormal findings: Secondary | ICD-10-CM | POA: Diagnosis not present

## 2019-11-11 DIAGNOSIS — Z23 Encounter for immunization: Secondary | ICD-10-CM | POA: Diagnosis not present

## 2019-11-13 DIAGNOSIS — E559 Vitamin D deficiency, unspecified: Secondary | ICD-10-CM | POA: Diagnosis not present

## 2019-11-17 DIAGNOSIS — Z8371 Family history of colonic polyps: Secondary | ICD-10-CM | POA: Diagnosis not present

## 2019-11-17 DIAGNOSIS — Z1211 Encounter for screening for malignant neoplasm of colon: Secondary | ICD-10-CM | POA: Diagnosis not present

## 2019-12-17 DIAGNOSIS — Z6822 Body mass index (BMI) 22.0-22.9, adult: Secondary | ICD-10-CM | POA: Diagnosis not present

## 2019-12-17 DIAGNOSIS — R6882 Decreased libido: Secondary | ICD-10-CM | POA: Diagnosis not present

## 2019-12-17 DIAGNOSIS — Z01419 Encounter for gynecological examination (general) (routine) without abnormal findings: Secondary | ICD-10-CM | POA: Diagnosis not present

## 2019-12-17 DIAGNOSIS — Z1151 Encounter for screening for human papillomavirus (HPV): Secondary | ICD-10-CM | POA: Diagnosis not present

## 2019-12-29 DIAGNOSIS — Z23 Encounter for immunization: Secondary | ICD-10-CM | POA: Diagnosis not present

## 2020-01-06 DIAGNOSIS — Z8371 Family history of colonic polyps: Secondary | ICD-10-CM | POA: Diagnosis not present

## 2020-01-06 DIAGNOSIS — Z1211 Encounter for screening for malignant neoplasm of colon: Secondary | ICD-10-CM | POA: Diagnosis not present

## 2020-08-02 DIAGNOSIS — H5213 Myopia, bilateral: Secondary | ICD-10-CM | POA: Diagnosis not present

## 2020-08-02 DIAGNOSIS — H524 Presbyopia: Secondary | ICD-10-CM | POA: Diagnosis not present

## 2020-09-13 ENCOUNTER — Other Ambulatory Visit: Payer: Self-pay | Admitting: Obstetrics & Gynecology

## 2020-09-13 DIAGNOSIS — Z1231 Encounter for screening mammogram for malignant neoplasm of breast: Secondary | ICD-10-CM

## 2020-09-13 DIAGNOSIS — Z23 Encounter for immunization: Secondary | ICD-10-CM | POA: Diagnosis not present

## 2020-09-15 ENCOUNTER — Ambulatory Visit
Admission: RE | Admit: 2020-09-15 | Discharge: 2020-09-15 | Disposition: A | Discharge status: Home / Self Care | Payer: BC Managed Care – PPO | Source: Ambulatory Visit

## 2020-09-15 ENCOUNTER — Other Ambulatory Visit: Payer: Self-pay

## 2020-09-15 DIAGNOSIS — Z1231 Encounter for screening mammogram for malignant neoplasm of breast: Secondary | ICD-10-CM

## 2020-11-15 DIAGNOSIS — M7710 Lateral epicondylitis, unspecified elbow: Secondary | ICD-10-CM | POA: Diagnosis not present

## 2020-11-15 DIAGNOSIS — G4700 Insomnia, unspecified: Secondary | ICD-10-CM | POA: Diagnosis not present

## 2020-11-15 DIAGNOSIS — Z7185 Encounter for immunization safety counseling: Secondary | ICD-10-CM | POA: Diagnosis not present

## 2020-11-15 DIAGNOSIS — Z23 Encounter for immunization: Secondary | ICD-10-CM | POA: Diagnosis not present

## 2020-12-05 DIAGNOSIS — R102 Pelvic and perineal pain: Secondary | ICD-10-CM | POA: Diagnosis not present

## 2021-01-23 DIAGNOSIS — Z0000 Encounter for general adult medical examination without abnormal findings: Secondary | ICD-10-CM | POA: Diagnosis not present

## 2021-02-16 DIAGNOSIS — F331 Major depressive disorder, recurrent, moderate: Secondary | ICD-10-CM | POA: Diagnosis not present

## 2021-02-16 DIAGNOSIS — F411 Generalized anxiety disorder: Secondary | ICD-10-CM | POA: Diagnosis not present

## 2021-02-16 DIAGNOSIS — Z0000 Encounter for general adult medical examination without abnormal findings: Secondary | ICD-10-CM | POA: Diagnosis not present

## 2021-02-16 DIAGNOSIS — G4700 Insomnia, unspecified: Secondary | ICD-10-CM | POA: Diagnosis not present

## 2021-02-27 DIAGNOSIS — F4323 Adjustment disorder with mixed anxiety and depressed mood: Secondary | ICD-10-CM | POA: Diagnosis not present

## 2021-03-06 DIAGNOSIS — F4323 Adjustment disorder with mixed anxiety and depressed mood: Secondary | ICD-10-CM | POA: Diagnosis not present

## 2021-03-15 DIAGNOSIS — J069 Acute upper respiratory infection, unspecified: Secondary | ICD-10-CM | POA: Diagnosis not present

## 2021-03-15 DIAGNOSIS — G4700 Insomnia, unspecified: Secondary | ICD-10-CM | POA: Diagnosis not present

## 2021-03-15 DIAGNOSIS — J329 Chronic sinusitis, unspecified: Secondary | ICD-10-CM | POA: Diagnosis not present

## 2021-03-15 DIAGNOSIS — B9689 Other specified bacterial agents as the cause of diseases classified elsewhere: Secondary | ICD-10-CM | POA: Diagnosis not present

## 2021-03-16 DIAGNOSIS — F4323 Adjustment disorder with mixed anxiety and depressed mood: Secondary | ICD-10-CM | POA: Diagnosis not present

## 2021-03-23 DIAGNOSIS — F4323 Adjustment disorder with mixed anxiety and depressed mood: Secondary | ICD-10-CM | POA: Diagnosis not present

## 2021-03-27 DIAGNOSIS — G4700 Insomnia, unspecified: Secondary | ICD-10-CM | POA: Diagnosis not present

## 2021-03-27 DIAGNOSIS — J309 Allergic rhinitis, unspecified: Secondary | ICD-10-CM | POA: Diagnosis not present

## 2021-03-27 DIAGNOSIS — F331 Major depressive disorder, recurrent, moderate: Secondary | ICD-10-CM | POA: Diagnosis not present

## 2021-03-27 DIAGNOSIS — F411 Generalized anxiety disorder: Secondary | ICD-10-CM | POA: Diagnosis not present

## 2021-03-30 DIAGNOSIS — F4323 Adjustment disorder with mixed anxiety and depressed mood: Secondary | ICD-10-CM | POA: Diagnosis not present

## 2021-04-06 DIAGNOSIS — F4323 Adjustment disorder with mixed anxiety and depressed mood: Secondary | ICD-10-CM | POA: Diagnosis not present

## 2021-04-13 DIAGNOSIS — F4323 Adjustment disorder with mixed anxiety and depressed mood: Secondary | ICD-10-CM | POA: Diagnosis not present

## 2021-04-18 DIAGNOSIS — F4323 Adjustment disorder with mixed anxiety and depressed mood: Secondary | ICD-10-CM | POA: Diagnosis not present

## 2021-04-19 DIAGNOSIS — F4323 Adjustment disorder with mixed anxiety and depressed mood: Secondary | ICD-10-CM | POA: Diagnosis not present

## 2021-05-03 DIAGNOSIS — F4323 Adjustment disorder with mixed anxiety and depressed mood: Secondary | ICD-10-CM | POA: Diagnosis not present

## 2021-05-04 DIAGNOSIS — F4323 Adjustment disorder with mixed anxiety and depressed mood: Secondary | ICD-10-CM | POA: Diagnosis not present

## 2021-05-09 DIAGNOSIS — F4323 Adjustment disorder with mixed anxiety and depressed mood: Secondary | ICD-10-CM | POA: Diagnosis not present

## 2021-05-11 DIAGNOSIS — F4323 Adjustment disorder with mixed anxiety and depressed mood: Secondary | ICD-10-CM | POA: Diagnosis not present

## 2021-05-15 DIAGNOSIS — F411 Generalized anxiety disorder: Secondary | ICD-10-CM | POA: Diagnosis not present

## 2021-05-15 DIAGNOSIS — F331 Major depressive disorder, recurrent, moderate: Secondary | ICD-10-CM | POA: Diagnosis not present

## 2021-05-15 DIAGNOSIS — G4700 Insomnia, unspecified: Secondary | ICD-10-CM | POA: Diagnosis not present

## 2021-06-07 DIAGNOSIS — Z01419 Encounter for gynecological examination (general) (routine) without abnormal findings: Secondary | ICD-10-CM | POA: Diagnosis not present

## 2021-06-07 DIAGNOSIS — Z6821 Body mass index (BMI) 21.0-21.9, adult: Secondary | ICD-10-CM | POA: Diagnosis not present

## 2021-06-12 DIAGNOSIS — F4323 Adjustment disorder with mixed anxiety and depressed mood: Secondary | ICD-10-CM | POA: Diagnosis not present

## 2021-06-27 DIAGNOSIS — F4323 Adjustment disorder with mixed anxiety and depressed mood: Secondary | ICD-10-CM | POA: Diagnosis not present

## 2021-08-16 ENCOUNTER — Other Ambulatory Visit: Payer: Self-pay | Admitting: Obstetrics & Gynecology

## 2021-08-16 DIAGNOSIS — Z1231 Encounter for screening mammogram for malignant neoplasm of breast: Secondary | ICD-10-CM

## 2021-09-18 DIAGNOSIS — Z1231 Encounter for screening mammogram for malignant neoplasm of breast: Secondary | ICD-10-CM

## 2021-09-19 ENCOUNTER — Ambulatory Visit
Admission: RE | Admit: 2021-09-19 | Discharge: 2021-09-19 | Disposition: A | Discharge status: Home / Self Care | Payer: BC Managed Care – PPO | Source: Ambulatory Visit | Attending: Obstetrics & Gynecology | Admitting: Obstetrics & Gynecology

## 2021-09-19 DIAGNOSIS — Z1231 Encounter for screening mammogram for malignant neoplasm of breast: Secondary | ICD-10-CM

## 2021-12-21 DIAGNOSIS — H5213 Myopia, bilateral: Secondary | ICD-10-CM | POA: Diagnosis not present

## 2021-12-21 DIAGNOSIS — H524 Presbyopia: Secondary | ICD-10-CM | POA: Diagnosis not present

## 2022-09-12 ENCOUNTER — Other Ambulatory Visit: Payer: Self-pay | Admitting: Obstetrics & Gynecology

## 2022-09-12 DIAGNOSIS — Z1231 Encounter for screening mammogram for malignant neoplasm of breast: Secondary | ICD-10-CM

## 2022-10-12 ENCOUNTER — Ambulatory Visit
Admission: RE | Admit: 2022-10-12 | Discharge: 2022-10-12 | Disposition: A | Discharge status: Home / Self Care | Payer: BC Managed Care – PPO | Source: Ambulatory Visit

## 2022-10-12 DIAGNOSIS — Z1231 Encounter for screening mammogram for malignant neoplasm of breast: Secondary | ICD-10-CM

## 2022-10-24 DIAGNOSIS — Z124 Encounter for screening for malignant neoplasm of cervix: Secondary | ICD-10-CM | POA: Diagnosis not present

## 2022-10-24 DIAGNOSIS — Z01419 Encounter for gynecological examination (general) (routine) without abnormal findings: Secondary | ICD-10-CM | POA: Diagnosis not present

## 2022-12-28 DIAGNOSIS — F411 Generalized anxiety disorder: Secondary | ICD-10-CM | POA: Diagnosis not present

## 2022-12-28 DIAGNOSIS — Z23 Encounter for immunization: Secondary | ICD-10-CM | POA: Diagnosis not present

## 2022-12-28 DIAGNOSIS — G4700 Insomnia, unspecified: Secondary | ICD-10-CM | POA: Diagnosis not present

## 2023-01-22 DIAGNOSIS — N3000 Acute cystitis without hematuria: Secondary | ICD-10-CM | POA: Diagnosis not present

## 2023-01-25 DIAGNOSIS — G4700 Insomnia, unspecified: Secondary | ICD-10-CM | POA: Diagnosis not present

## 2023-01-25 DIAGNOSIS — F411 Generalized anxiety disorder: Secondary | ICD-10-CM | POA: Diagnosis not present

## 2023-03-25 DIAGNOSIS — N939 Abnormal uterine and vaginal bleeding, unspecified: Secondary | ICD-10-CM | POA: Diagnosis not present

## 2023-03-25 DIAGNOSIS — N959 Unspecified menopausal and perimenopausal disorder: Secondary | ICD-10-CM | POA: Diagnosis not present

## 2023-03-25 DIAGNOSIS — N390 Urinary tract infection, site not specified: Secondary | ICD-10-CM | POA: Diagnosis not present

## 2023-04-16 DIAGNOSIS — Z1159 Encounter for screening for other viral diseases: Secondary | ICD-10-CM | POA: Diagnosis not present

## 2023-04-16 DIAGNOSIS — J069 Acute upper respiratory infection, unspecified: Secondary | ICD-10-CM | POA: Diagnosis not present

## 2023-04-23 DIAGNOSIS — N939 Abnormal uterine and vaginal bleeding, unspecified: Secondary | ICD-10-CM | POA: Diagnosis not present

## 2023-06-13 ENCOUNTER — Ambulatory Visit (INDEPENDENT_AMBULATORY_CARE_PROVIDER_SITE_OTHER): Admitting: Podiatry

## 2023-06-13 ENCOUNTER — Ambulatory Visit (INDEPENDENT_AMBULATORY_CARE_PROVIDER_SITE_OTHER)

## 2023-06-13 ENCOUNTER — Encounter: Payer: Self-pay | Admitting: Podiatry

## 2023-06-13 DIAGNOSIS — M7751 Other enthesopathy of right foot: Secondary | ICD-10-CM

## 2023-06-13 DIAGNOSIS — M722 Plantar fascial fibromatosis: Secondary | ICD-10-CM | POA: Diagnosis not present

## 2023-06-13 DIAGNOSIS — L84 Corns and callosities: Secondary | ICD-10-CM | POA: Diagnosis not present

## 2023-06-13 DIAGNOSIS — M7752 Other enthesopathy of left foot: Secondary | ICD-10-CM

## 2023-06-13 DIAGNOSIS — M7732 Calcaneal spur, left foot: Secondary | ICD-10-CM | POA: Diagnosis not present

## 2023-06-13 DIAGNOSIS — M7731 Calcaneal spur, right foot: Secondary | ICD-10-CM | POA: Diagnosis not present

## 2023-06-13 NOTE — Patient Instructions (Signed)
 For instructions on how to put on your Plantar Fascial Brace, please visit [email or web address]   Plantar Fasciitis (Heel Spur Syndrome) with Rehab The plantar fascia is a fibrous, ligament-like, soft-tissue structure that spans the bottom of the foot. Plantar fasciitis is a condition that causes pain in the foot due to inflammation of the tissue. SYMPTOMS   Pain and tenderness on the underneath side of the foot.  Pain that worsens with standing or walking. CAUSES  Plantar fasciitis is caused by irritation and injury to the plantar fascia on the underneath side of the foot. Common mechanisms of injury include:  Direct trauma to bottom of the foot.  Damage to a small nerve that runs under the foot where the main fascia attaches to the heel bone.  Stress placed on the plantar fascia due to bone spurs. RISK INCREASES WITH:   Activities that place stress on the plantar fascia (running, jumping, pivoting, or cutting).  Poor strength and flexibility.  Improperly fitted shoes.  Tight calf muscles.  Flat feet.  Failure to warm-up properly before activity.  Obesity. PREVENTION  Warm up and stretch properly before activity.  Allow for adequate recovery between workouts.  Maintain physical fitness:  Strength, flexibility, and endurance.  Cardiovascular fitness.  Maintain a health body weight.  Avoid stress on the plantar fascia.  Wear properly fitted shoes, including arch supports for individuals who have flat feet.  PROGNOSIS  If treated properly, then the symptoms of plantar fasciitis usually resolve without surgery. However, occasionally surgery is necessary.  RELATED COMPLICATIONS   Recurrent symptoms that may result in a chronic condition.  Problems of the lower back that are caused by compensating for the injury, such as limping.  Pain or weakness of the foot during push-off following surgery.  Chronic inflammation, scarring, and partial or complete  fascia tear, occurring more often from repeated injections.  TREATMENT  Treatment initially involves the use of ice and medication to help reduce pain and inflammation. The use of strengthening and stretching exercises may help reduce pain with activity, especially stretches of the Achilles tendon. These exercises may be performed at home or with a therapist. Your caregiver may recommend that you use heel cups of arch supports to help reduce stress on the plantar fascia. Occasionally, corticosteroid injections are given to reduce inflammation. If symptoms persist for greater than 6 months despite non-surgical (conservative), then surgery may be recommended.   MEDICATION   If pain medication is necessary, then nonsteroidal anti-inflammatory medications, such as aspirin and ibuprofen, or other minor pain relievers, such as acetaminophen, are often recommended.  Do not take pain medication within 7 days before surgery.  Prescription pain relievers may be given if deemed necessary by your caregiver. Use only as directed and only as much as you need.  Corticosteroid injections may be given by your caregiver. These injections should be reserved for the most serious cases, because they may only be given a certain number of times.  HEAT AND COLD  Cold treatment (icing) relieves pain and reduces inflammation. Cold treatment should be applied for 10 to 15 minutes every 2 to 3 hours for inflammation and pain and immediately after any activity that aggravates your symptoms. Use ice packs or massage the area with a piece of ice (ice massage).  Heat treatment may be used prior to performing the stretching and strengthening activities prescribed by your caregiver, physical therapist, or athletic trainer. Use a heat pack or soak the injury in warm water.  SEEK IMMEDIATE MEDICAL  CARE IF:  Treatment seems to offer no benefit, or the condition worsens.  Any medications produce adverse side effects.   EXERCISES- RANGE OF MOTION (ROM) AND STRETCHING EXERCISES - Plantar Fasciitis (Heel Spur Syndrome) These exercises may help you when beginning to rehabilitate your injury. Your symptoms may resolve with or without further involvement from your physician, physical therapist or athletic trainer. While completing these exercises, remember:   Restoring tissue flexibility helps normal motion to return to the joints. This allows healthier, less painful movement and activity.  An effective stretch should be held for at least 30 seconds.  A stretch should never be painful. You should only feel a gentle lengthening or release in the stretched tissue.  RANGE OF MOTION - Toe Extension, Flexion  Sit with your right / left leg crossed over your opposite knee.  Grasp your toes and gently pull them back toward the top of your foot. You should feel a stretch on the bottom of your toes and/or foot.  Hold this stretch for 10 seconds.  Now, gently pull your toes toward the bottom of your foot. You should feel a stretch on the top of your toes and or foot.  Hold this stretch for 10 seconds. Repeat  times. Complete this stretch 3 times per day.   RANGE OF MOTION - Ankle Dorsiflexion, Active Assisted  Remove shoes and sit on a chair that is preferably not on a carpeted surface.  Place right / left foot under knee. Extend your opposite leg for support.  Keeping your heel down, slide your right / left foot back toward the chair until you feel a stretch at your ankle or calf. If you do not feel a stretch, slide your bottom forward to the edge of the chair, while still keeping your heel down.  Hold this stretch for 10 seconds. Repeat 3 times. Complete this stretch 2 times per day.   STRETCH  Gastroc, Standing  Place hands on wall.  Extend right / left leg, keeping the front knee somewhat bent.  Slightly point your toes inward on your back foot.  Keeping your right / left heel on the floor and your  knee straight, shift your weight toward the wall, not allowing your back to arch.  You should feel a gentle stretch in the right / left calf. Hold this position for 10 seconds. Repeat 3 times. Complete this stretch 2 times per day.  STRETCH  Soleus, Standing  Place hands on wall.  Extend right / left leg, keeping the other knee somewhat bent.  Slightly point your toes inward on your back foot.  Keep your right / left heel on the floor, bend your back knee, and slightly shift your weight over the back leg so that you feel a gentle stretch deep in your back calf.  Hold this position for 10 seconds. Repeat 3 times. Complete this stretch 2 times per day.  STRETCH  Gastrocsoleus, Standing  Note: This exercise can place a lot of stress on your foot and ankle. Please complete this exercise only if specifically instructed by your caregiver.   Place the ball of your right / left foot on a step, keeping your other foot firmly on the same step.  Hold on to the wall or a rail for balance.  Slowly lift your other foot, allowing your body weight to press your heel down over the edge of the step.  You should feel a stretch in your right / left calf.  Hold this  position for 10 seconds.  Repeat this exercise with a slight bend in your right / left knee. Repeat 3 times. Complete this stretch 2 times per day.   STRENGTHENING EXERCISES - Plantar Fasciitis (Heel Spur Syndrome)  These exercises may help you when beginning to rehabilitate your injury. They may resolve your symptoms with or without further involvement from your physician, physical therapist or athletic trainer. While completing these exercises, remember:   Muscles can gain both the endurance and the strength needed for everyday activities through controlled exercises.  Complete these exercises as instructed by your physician, physical therapist or athletic trainer. Progress the resistance and repetitions only as guided.  STRENGTH -  Towel Curls  Sit in a chair positioned on a non-carpeted surface.  Place your foot on a towel, keeping your heel on the floor.  Pull the towel toward your heel by only curling your toes. Keep your heel on the floor. Repeat 3 times. Complete this exercise 2 times per day.  STRENGTH - Ankle Inversion  Secure one end of a rubber exercise band/tubing to a fixed object (table, pole). Loop the other end around your foot just before your toes.  Place your fists between your knees. This will focus your strengthening at your ankle.  Slowly, pull your big toe up and in, making sure the band/tubing is positioned to resist the entire motion.  Hold this position for 10 seconds.  Have your muscles resist the band/tubing as it slowly pulls your foot back to the starting position. Repeat 3 times. Complete this exercises 2 times per day.  Document Released: 02/05/2005 Document Revised: 04/30/2011 Document Reviewed: 05/20/2008 [facility] Patient Information 2014 [place], [place].

## 2023-06-13 NOTE — Progress Notes (Signed)
 Subjective:   Patient ID: Carolyn Ferguson, female   DOB: 53 y.o.   MRN: [id number]   HPI Chief Complaint  Patient presents with   Foot Pain    RM#11 Bilateral heel pain mornings are worse started around 1 month ago.   53 year old female presents the office with above concerns.  She has had bilateral heel pain for about 1 month worse in the mornings.  She is an avid walker and tries to stay active.  She also has a callus her wart on the bottom of her left heel which also causes discomfort.  This has been ongoing for some time.  No open lesions.  No injuries.  No concerns.   Review of Systems  All other systems reviewed and are negative.  Past Medical History:  Diagnosis Date   Endometriosis    IUD (intrauterine device) in place    MIRENA      Ovarian cyst    Pelvic pain 09/16/2016   Umbilical hernia     Past Surgical History:  Procedure Laterality Date   BREAST BIOPSY Right    u/s guided biopsy   CESAREAN SECTION     with right salpingectomy   PELVIC LAPAROSCOPY     DX LAP W LASER VAP OF ENDOMETRIOSIS--LEFT SALPINGECTOMY   UMBILICAL HERNIA REPAIR N/A 05/02/2015   Procedure:  UMBILICAL HERNIA REPAIR;  Surgeon: Jerryl Morin, MD;  Location: [facility];  Service: General;  Laterality: N/A;     Current Outpatient Medications:    cholecalciferol (VITAMIN D) 1000 units tablet, Take 1,000 Units by mouth daily., Disp: , Rfl:    ibuprofen  (ADVIL ,MOTRIN ) 600 MG tablet, Take 1 tablet (600 mg total) by mouth every 6 (six) hours as needed., Disp: 30 tablet, Rfl: 0   levonorgestrel  (MIRENA ) 20 MCG/24HR IUD, 1 each by Intrauterine route once. , Disp: , Rfl:    oxyCODONE -acetaminophen  (PERCOCET/ROXICET) 5-325 MG tablet, Take 1-2 tablets by mouth every 6 (six) hours as needed for severe pain. (Patient not taking: Reported on 06/13/2023), Disp: 15 tablet, Rfl: 0  No Known Allergies        Objective:  Physical Exam  General: AAO x3, NAD  Dermatological: Thick  hyperkeratotic lesion plantar aspect left heel without any underlying ulceration, drainage or signs of infection.  There are no open lesions identified today.  There is no pinpoint bleeding or evidence of verruca.  Vascular: Dorsalis Pedis artery and Posterior Tibial artery pedal pulses are 2/4 bilateral with immedate capillary fill time. There is no pain with calf compression, swelling, warmth, erythema.   Neruologic: Grossly intact via light touch bilateral.  Negative Tinel sign.  Musculoskeletal: Tenderness palpation on plantar aspect of calcaneus at the insertion of plantar fascia bilaterally as well as the left hyperkeratotic lesion.  There is no pain with lateral compression of the calcaneus.  There is no pain in the Achilles tendon.  No area pinpoint tenderness otherwise.  No edema, erythema.  MMT 5/5.  Gait: Unassisted, Nonantalgic.       Assessment:   53 year old female with bilateral heel pain, plantar fasciitis;      Plan:  -Treatment options discussed including all alternatives, risks, and complications -Etiology of symptoms were discussed -X-rays were obtained and reviewed with the patient.  3 views bilateral feet were obtained.  No evidence of acute fracture.  Calcaneal spurring present.  Bunion is noted. -Sharply debrided hyperkeratotic lesion without any complication or bleeding.  Discussed urea cream. -In regards to the plantar fasciitis we discussed stretching,  icing regular basis.  Discussed shoes, good arch support which she is not a good job with.  Discussed anti-inflammatories repeat steroid injection if needed.  Offered plantar fascia braces.  Charity Conch DPM

## 2023-07-08 IMAGING — MG MM DIGITAL SCREENING BILAT W/ TOMO AND CAD
8 series · 9 of 24 positions shown · non-contrast
Comparison: Previous exam(s).

CLINICAL DATA: Screening.

EXAM:
DIGITAL SCREENING BILATERAL MAMMOGRAM WITH TOMOSYNTHESIS AND CAD
TECHNIQUE: Bilateral screening digital craniocaudal and mediolateral oblique
mammograms were obtained. Bilateral screening digital breast
tomosynthesis was performed. The images were evaluated with
computer-aided detection.

[R MLO synth-2D]
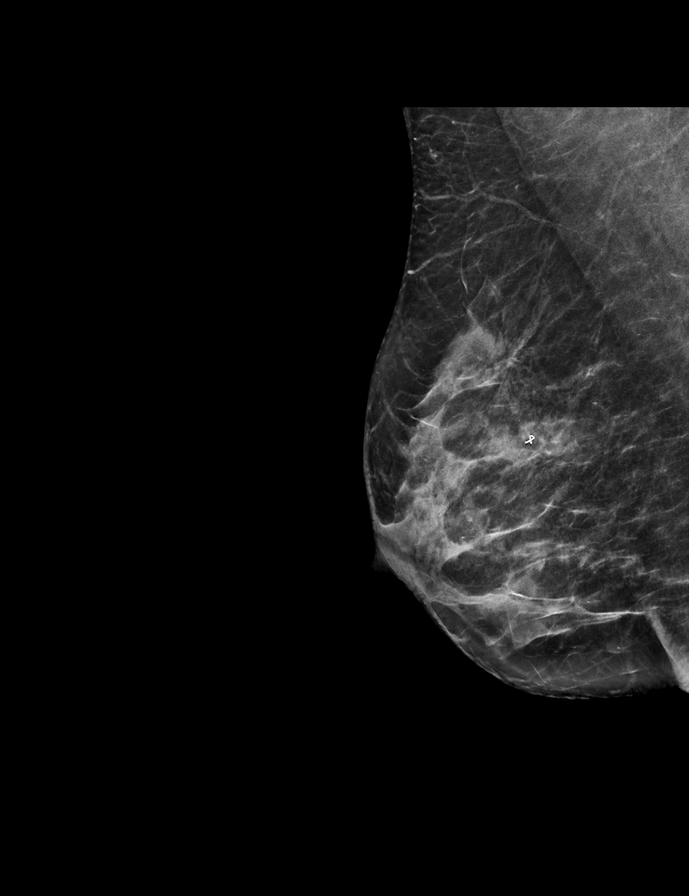

[R CC synth-2D]
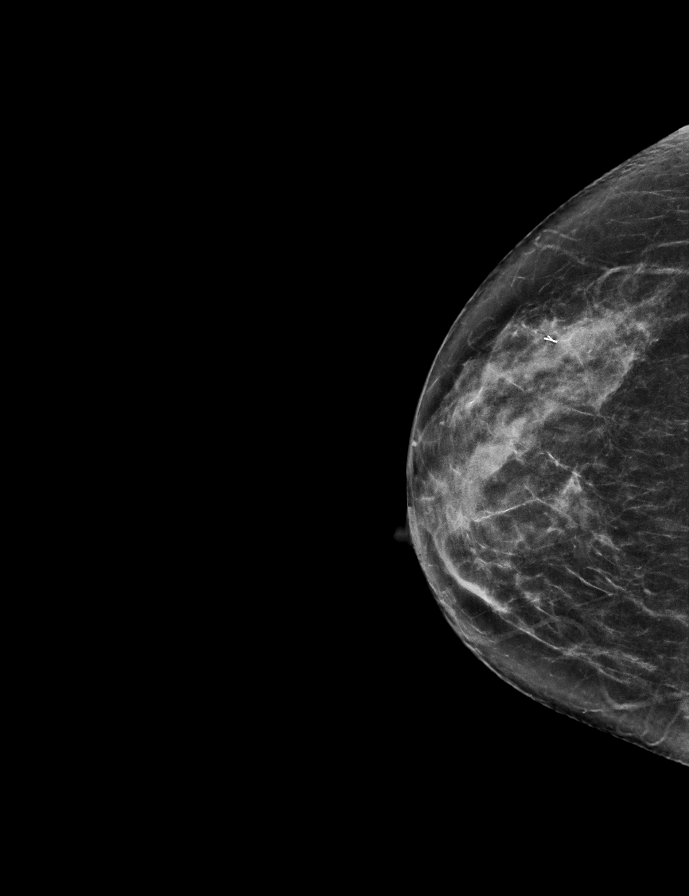

[L CC synth-2D]
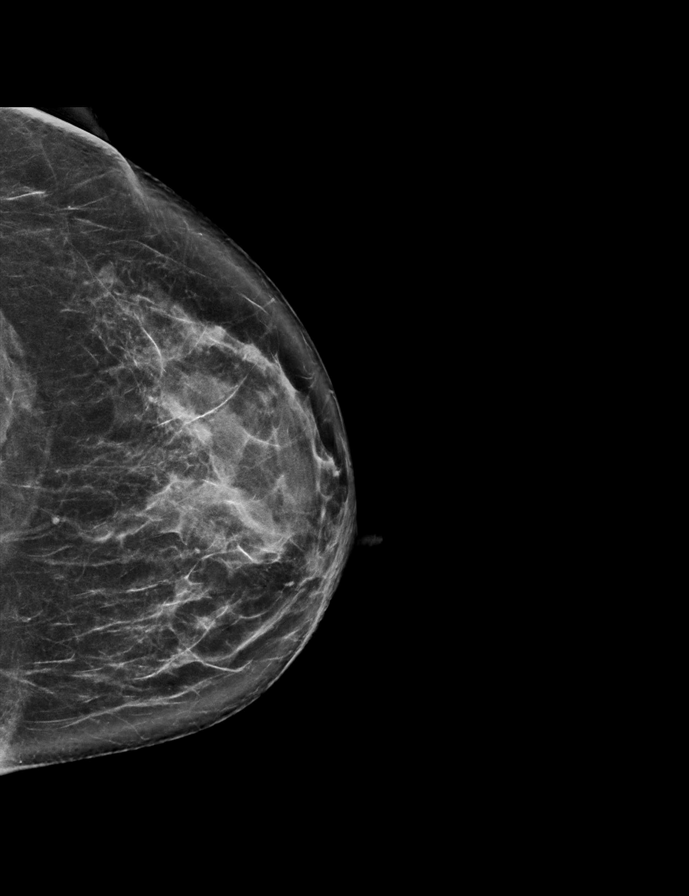

[L MLO synth-2D]
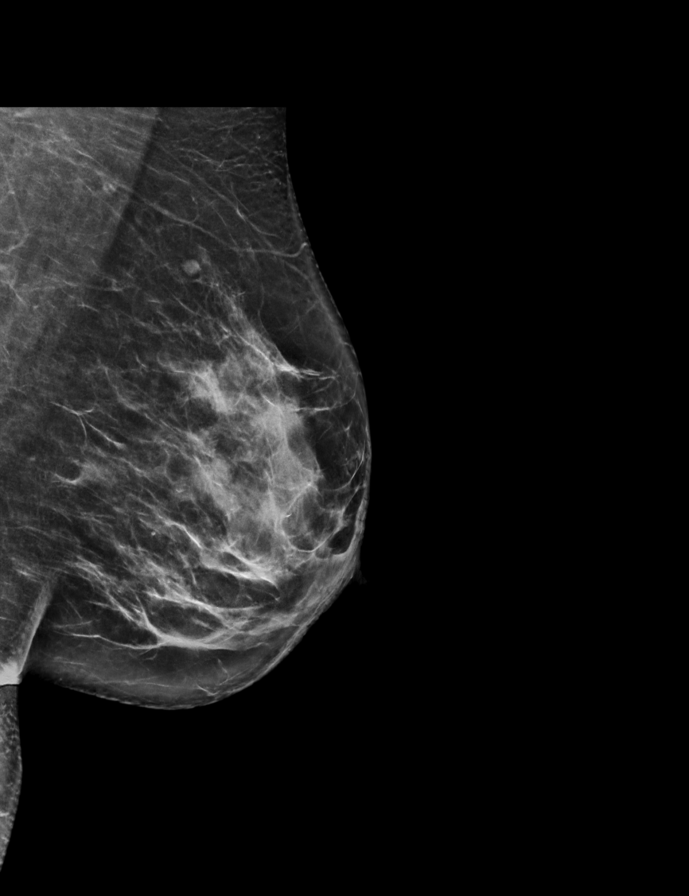

[L MLO tomo · 2 of 69 frames shown]
[frame 23/69]
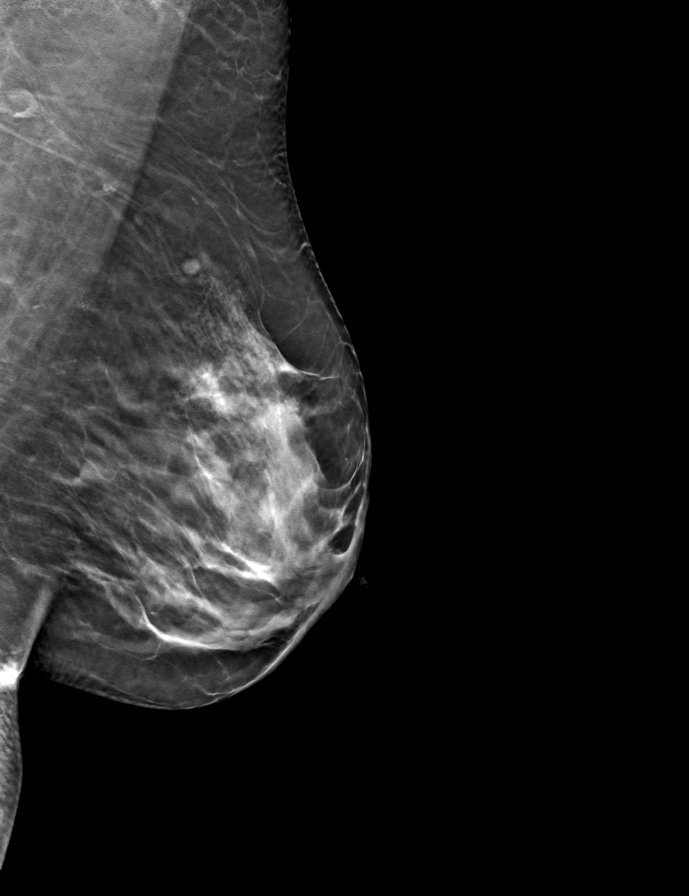
[frame 35/69]
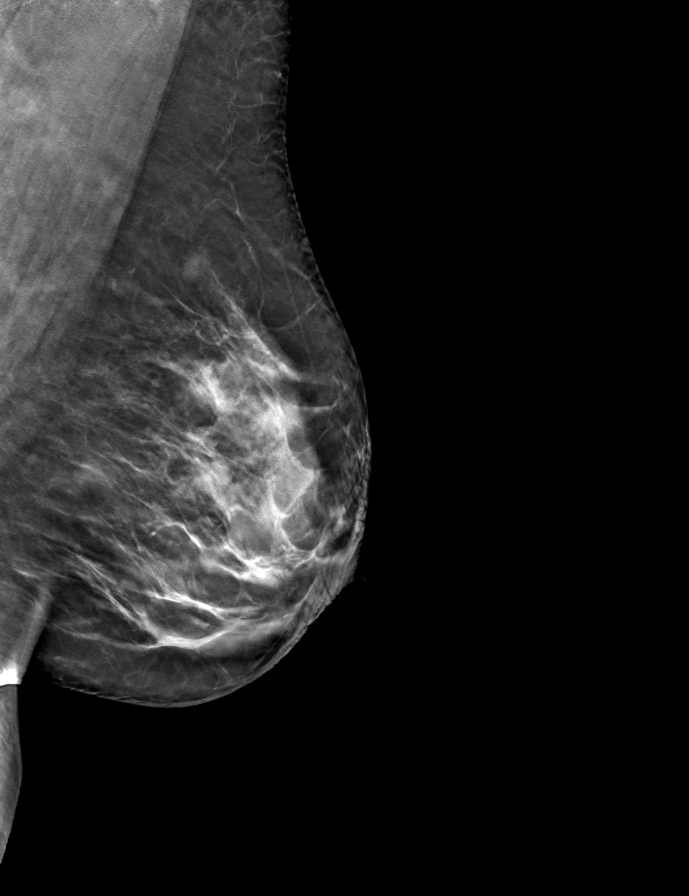

[R MLO tomo · tomo slice 31/61.0]
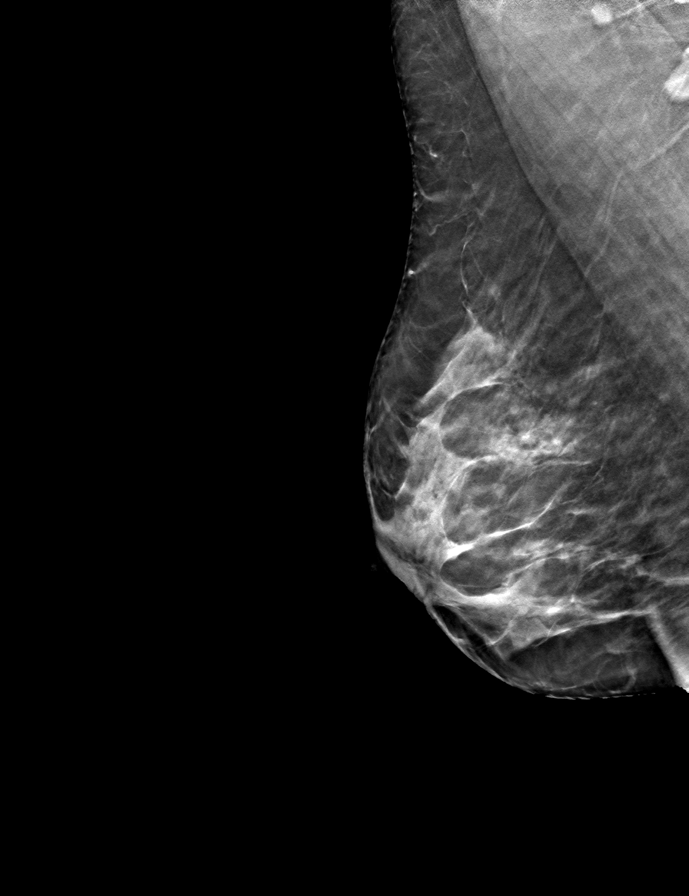

[L CC tomo · tomo slice 35/68.0]
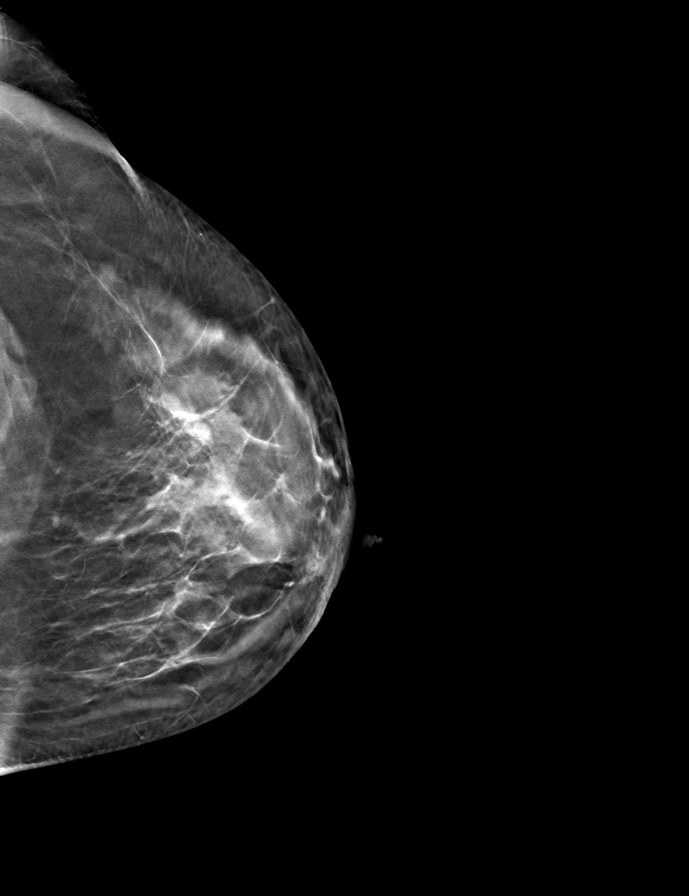

[R CC tomo · tomo slice 31/60.0]
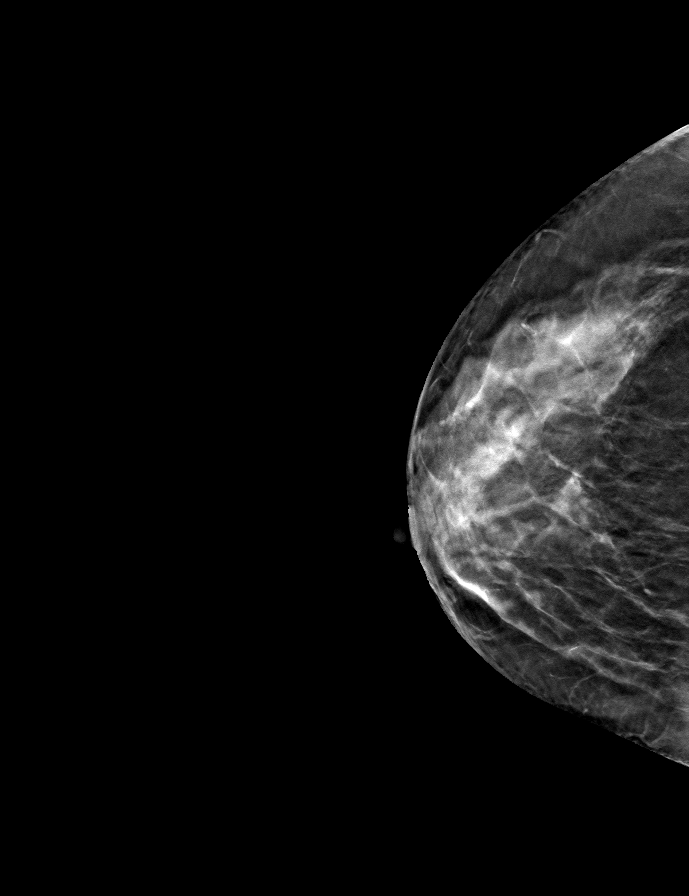

[9 of 24 positions shown; findings below may reference images not displayed]

ACR Breast Density Category c: The breast tissue is heterogeneously
dense, which may obscure small masses.
FINDINGS: There are no findings suspicious for malignancy.
IMPRESSION: No mammographic evidence of malignancy. A result letter of this
screening mammogram will be mailed directly to the patient.

RECOMMENDATION:
Screening mammogram in one year. (Code:Q3-W-BC3)

BI-RADS CATEGORY  1: Negative.

## 2023-07-29 DIAGNOSIS — Z13220 Encounter for screening for lipoid disorders: Secondary | ICD-10-CM | POA: Diagnosis not present

## 2023-07-29 DIAGNOSIS — E559 Vitamin D deficiency, unspecified: Secondary | ICD-10-CM | POA: Diagnosis not present

## 2023-07-29 DIAGNOSIS — Z0000 Encounter for general adult medical examination without abnormal findings: Secondary | ICD-10-CM | POA: Diagnosis not present

## 2023-08-02 DIAGNOSIS — Z0000 Encounter for general adult medical examination without abnormal findings: Secondary | ICD-10-CM | POA: Diagnosis not present

## 2023-08-02 DIAGNOSIS — R748 Abnormal levels of other serum enzymes: Secondary | ICD-10-CM | POA: Diagnosis not present

## 2023-08-07 DIAGNOSIS — N912 Amenorrhea, unspecified: Secondary | ICD-10-CM | POA: Diagnosis not present

## 2023-08-07 DIAGNOSIS — N939 Abnormal uterine and vaginal bleeding, unspecified: Secondary | ICD-10-CM | POA: Diagnosis not present

## 2023-08-09 DIAGNOSIS — I10 Essential (primary) hypertension: Secondary | ICD-10-CM | POA: Diagnosis not present

## 2023-08-12 DIAGNOSIS — Z13820 Encounter for screening for osteoporosis: Secondary | ICD-10-CM | POA: Diagnosis not present

## 2023-08-16 DIAGNOSIS — N809 Endometriosis, unspecified: Secondary | ICD-10-CM | POA: Diagnosis not present

## 2023-08-16 DIAGNOSIS — R748 Abnormal levels of other serum enzymes: Secondary | ICD-10-CM | POA: Diagnosis not present

## 2023-08-27 DIAGNOSIS — Z30432 Encounter for removal of intrauterine contraceptive device: Secondary | ICD-10-CM | POA: Diagnosis not present

## 2023-08-29 ENCOUNTER — Other Ambulatory Visit (HOSPITAL_COMMUNITY): Payer: Self-pay

## 2023-08-29 MED ORDER — PROGESTERONE MICRONIZED 100 MG PO CAPS
100.0000 mg | ORAL_CAPSULE | Freq: Every day | ORAL | 11 refills | Status: AC
  Filled 2023-08-29: qty 30, 30d supply, fill #0
  Filled 2023-09-19 – 2023-09-28 (×3): qty 30, 30d supply, fill #1
  Filled 2023-10-22: qty 30, 30d supply, fill #0
  Filled 2023-11-21: qty 30, 30d supply, fill #1
  Filled 2023-12-25: qty 30, 30d supply, fill #2
  Filled 2024-01-24: qty 30, 30d supply, fill #3
  Filled 2024-02-21: qty 30, 30d supply, fill #4
  Filled 2024-03-23 – 2024-03-25 (×2): qty 90, 90d supply, fill #5
  Filled 2024-03-25: qty 7, 7d supply, fill #5
  Filled 2024-03-25: qty 23, 23d supply, fill #5
  Filled 2024-03-25: qty 30, 30d supply, fill #5
  Filled 2024-03-26 (×3): qty 90, 90d supply, fill #5

## 2023-08-29 MED ORDER — ESTRADIOL 0.05 MG/24HR TD PTTW
1.0000 | MEDICATED_PATCH | TRANSDERMAL | 11 refills | Status: AC
  Filled 2023-08-29: qty 8, 28d supply, fill #0
  Filled 2023-09-19 – 2023-09-20 (×2): qty 8, 28d supply, fill #1
  Filled 2023-10-22: qty 8, 28d supply, fill #0
  Filled 2023-11-18: qty 8, 28d supply, fill #1
  Filled 2023-12-12: qty 8, 28d supply, fill #2
  Filled 2024-01-10: qty 8, 28d supply, fill #3
  Filled 2024-02-06: qty 8, 28d supply, fill #4
  Filled 2024-03-04 (×2): qty 8, 28d supply, fill #5

## 2023-09-04 ENCOUNTER — Other Ambulatory Visit: Payer: Self-pay | Admitting: Family Medicine

## 2023-09-04 DIAGNOSIS — Z1231 Encounter for screening mammogram for malignant neoplasm of breast: Secondary | ICD-10-CM

## 2023-09-09 ENCOUNTER — Other Ambulatory Visit (HOSPITAL_COMMUNITY): Payer: Self-pay

## 2023-09-09 DIAGNOSIS — Z780 Asymptomatic menopausal state: Secondary | ICD-10-CM | POA: Diagnosis not present

## 2023-09-09 DIAGNOSIS — M722 Plantar fascial fibromatosis: Secondary | ICD-10-CM | POA: Diagnosis not present

## 2023-09-09 DIAGNOSIS — R202 Paresthesia of skin: Secondary | ICD-10-CM | POA: Diagnosis not present

## 2023-09-09 MED ORDER — DICLOFENAC SODIUM 1 % EX GEL
2.0000 g | Freq: Four times a day (QID) | CUTANEOUS | 1 refills | Status: AC
  Filled 2023-09-09: qty 100, 13d supply, fill #0

## 2023-09-16 DIAGNOSIS — D649 Anemia, unspecified: Secondary | ICD-10-CM | POA: Diagnosis not present

## 2023-09-16 DIAGNOSIS — R5383 Other fatigue: Secondary | ICD-10-CM | POA: Diagnosis not present

## 2023-09-19 ENCOUNTER — Other Ambulatory Visit (HOSPITAL_COMMUNITY): Payer: Self-pay

## 2023-09-28 ENCOUNTER — Other Ambulatory Visit (HOSPITAL_COMMUNITY): Payer: Self-pay

## 2023-09-30 ENCOUNTER — Other Ambulatory Visit (HOSPITAL_COMMUNITY): Payer: Self-pay

## 2023-10-01 ENCOUNTER — Other Ambulatory Visit (HOSPITAL_COMMUNITY): Payer: Self-pay

## 2023-10-01 DIAGNOSIS — M722 Plantar fascial fibromatosis: Secondary | ICD-10-CM | POA: Diagnosis not present

## 2023-10-01 DIAGNOSIS — N809 Endometriosis, unspecified: Secondary | ICD-10-CM | POA: Diagnosis not present

## 2023-10-01 DIAGNOSIS — M8580 Other specified disorders of bone density and structure, unspecified site: Secondary | ICD-10-CM | POA: Diagnosis not present

## 2023-10-01 DIAGNOSIS — Z79890 Hormone replacement therapy (postmenopausal): Secondary | ICD-10-CM | POA: Diagnosis not present

## 2023-10-01 DIAGNOSIS — E559 Vitamin D deficiency, unspecified: Secondary | ICD-10-CM | POA: Diagnosis not present

## 2023-10-01 DIAGNOSIS — G4700 Insomnia, unspecified: Secondary | ICD-10-CM | POA: Diagnosis not present

## 2023-10-01 DIAGNOSIS — Z23 Encounter for immunization: Secondary | ICD-10-CM | POA: Diagnosis not present

## 2023-10-01 MED ORDER — DICLOFENAC SODIUM 1 % EX GEL
4.0000 g | Freq: Four times a day (QID) | CUTANEOUS | 2 refills | Status: AC
  Filled 2023-10-01: qty 500, 30d supply, fill #0

## 2023-10-02 ENCOUNTER — Other Ambulatory Visit (HOSPITAL_COMMUNITY): Payer: Self-pay

## 2023-10-03 ENCOUNTER — Other Ambulatory Visit (HOSPITAL_COMMUNITY): Payer: Self-pay

## 2023-10-08 DIAGNOSIS — N951 Menopausal and female climacteric states: Secondary | ICD-10-CM | POA: Diagnosis not present

## 2023-10-14 ENCOUNTER — Encounter

## 2023-10-14 DIAGNOSIS — Z1231 Encounter for screening mammogram for malignant neoplasm of breast: Secondary | ICD-10-CM

## 2023-10-22 ENCOUNTER — Other Ambulatory Visit (HOSPITAL_COMMUNITY): Payer: Self-pay

## 2023-10-30 DIAGNOSIS — Z79890 Hormone replacement therapy (postmenopausal): Secondary | ICD-10-CM | POA: Diagnosis not present

## 2023-10-30 DIAGNOSIS — N809 Endometriosis, unspecified: Secondary | ICD-10-CM | POA: Diagnosis not present

## 2023-10-30 DIAGNOSIS — M722 Plantar fascial fibromatosis: Secondary | ICD-10-CM | POA: Diagnosis not present

## 2023-10-30 DIAGNOSIS — M791 Myalgia, unspecified site: Secondary | ICD-10-CM | POA: Diagnosis not present

## 2023-10-30 DIAGNOSIS — G4700 Insomnia, unspecified: Secondary | ICD-10-CM | POA: Diagnosis not present

## 2023-11-04 ENCOUNTER — Ambulatory Visit
Admission: RE | Admit: 2023-11-04 | Discharge: 2023-11-04 | Disposition: A | Discharge status: Home / Self Care | Source: Ambulatory Visit | Attending: Family Medicine | Admitting: Family Medicine

## 2023-11-04 DIAGNOSIS — Z1231 Encounter for screening mammogram for malignant neoplasm of breast: Secondary | ICD-10-CM

## 2024-01-11 ENCOUNTER — Other Ambulatory Visit (HOSPITAL_COMMUNITY): Payer: Self-pay

## 2024-02-06 ENCOUNTER — Other Ambulatory Visit (HOSPITAL_COMMUNITY): Payer: Self-pay

## 2024-03-04 ENCOUNTER — Other Ambulatory Visit (HOSPITAL_COMMUNITY): Payer: Self-pay

## 2024-03-05 ENCOUNTER — Other Ambulatory Visit (HOSPITAL_BASED_OUTPATIENT_CLINIC_OR_DEPARTMENT_OTHER): Payer: Self-pay

## 2024-03-07 ENCOUNTER — Other Ambulatory Visit (HOSPITAL_COMMUNITY): Payer: Self-pay

## 2024-03-24 ENCOUNTER — Other Ambulatory Visit (HOSPITAL_COMMUNITY): Payer: Self-pay

## 2024-03-25 ENCOUNTER — Other Ambulatory Visit: Payer: Self-pay

## 2024-03-25 ENCOUNTER — Other Ambulatory Visit (HOSPITAL_COMMUNITY): Payer: Self-pay

## 2024-03-25 ENCOUNTER — Encounter: Payer: Self-pay | Admitting: Pharmacist

## 2024-03-26 ENCOUNTER — Other Ambulatory Visit (HOSPITAL_COMMUNITY): Payer: Self-pay

## 2024-03-26 ENCOUNTER — Other Ambulatory Visit: Payer: Self-pay

## 2024-03-27 ENCOUNTER — Other Ambulatory Visit (HOSPITAL_COMMUNITY): Payer: Self-pay
